# Patient Record
Sex: Female | Born: 1935 | Race: Black or African American | Hispanic: No | Smoking: Never smoker
Health system: Southern US, Community
[De-identification: ages and names within clinical notes are randomized; demographics above are authoritative.]

## PROBLEM LIST (undated history)

## (undated) DIAGNOSIS — R627 Adult failure to thrive: Secondary | ICD-10-CM

## (undated) DIAGNOSIS — F0390 Unspecified dementia without behavioral disturbance: Secondary | ICD-10-CM

## (undated) DIAGNOSIS — R262 Difficulty in walking, not elsewhere classified: Secondary | ICD-10-CM

## (undated) DIAGNOSIS — E860 Dehydration: Secondary | ICD-10-CM

## (undated) DIAGNOSIS — N189 Chronic kidney disease, unspecified: Secondary | ICD-10-CM

## (undated) DIAGNOSIS — E559 Vitamin D deficiency, unspecified: Secondary | ICD-10-CM

## (undated) DIAGNOSIS — M6281 Muscle weakness (generalized): Secondary | ICD-10-CM

## (undated) DIAGNOSIS — F419 Anxiety disorder, unspecified: Secondary | ICD-10-CM

## (undated) DIAGNOSIS — R293 Abnormal posture: Secondary | ICD-10-CM

## (undated) DIAGNOSIS — K219 Gastro-esophageal reflux disease without esophagitis: Secondary | ICD-10-CM

## (undated) DIAGNOSIS — I6990 Unspecified sequelae of unspecified cerebrovascular disease: Secondary | ICD-10-CM

## (undated) DIAGNOSIS — I10 Essential (primary) hypertension: Secondary | ICD-10-CM

---

## 1999-10-14 ENCOUNTER — Encounter: Admission: RE | Admit: 1999-10-14 | Discharge: 1999-10-14 | Payer: Self-pay | Admitting: Neurosurgery

## 1999-10-14 ENCOUNTER — Encounter: Payer: Self-pay | Admitting: Neurosurgery

## 1999-10-16 ENCOUNTER — Inpatient Hospital Stay (HOSPITAL_COMMUNITY): Admission: RE | Admit: 1999-10-16 | Discharge: 1999-10-21 | Payer: Self-pay | Admitting: Neurosurgery

## 1999-10-16 ENCOUNTER — Encounter: Payer: Self-pay | Admitting: Neurosurgery

## 1999-12-02 ENCOUNTER — Encounter: Payer: Self-pay | Admitting: Neurosurgery

## 1999-12-02 ENCOUNTER — Encounter: Admission: RE | Admit: 1999-12-02 | Discharge: 1999-12-02 | Payer: Self-pay | Admitting: Neurosurgery

## 2000-02-02 ENCOUNTER — Encounter: Payer: Self-pay | Admitting: Neurosurgery

## 2000-02-02 ENCOUNTER — Encounter: Admission: RE | Admit: 2000-02-02 | Discharge: 2000-02-02 | Payer: Self-pay | Admitting: Neurosurgery

## 2000-06-07 ENCOUNTER — Encounter: Admission: RE | Admit: 2000-06-07 | Discharge: 2000-06-07 | Payer: Self-pay | Admitting: Neurosurgery

## 2000-06-07 ENCOUNTER — Encounter: Payer: Self-pay | Admitting: Neurosurgery

## 2000-11-08 ENCOUNTER — Encounter: Payer: Self-pay | Admitting: Neurosurgery

## 2000-11-08 ENCOUNTER — Encounter: Admission: RE | Admit: 2000-11-08 | Discharge: 2000-11-08 | Payer: Self-pay | Admitting: Neurosurgery

## 2004-09-01 ENCOUNTER — Ambulatory Visit: Payer: Self-pay | Admitting: Internal Medicine

## 2005-02-03 ENCOUNTER — Ambulatory Visit: Payer: Self-pay | Admitting: Internal Medicine

## 2006-02-23 ENCOUNTER — Ambulatory Visit: Payer: Self-pay | Admitting: Internal Medicine

## 2007-03-04 ENCOUNTER — Ambulatory Visit: Payer: Self-pay | Admitting: Internal Medicine

## 2007-03-15 ENCOUNTER — Ambulatory Visit: Payer: Self-pay | Admitting: Internal Medicine

## 2008-07-09 ENCOUNTER — Inpatient Hospital Stay: Payer: Self-pay | Admitting: Internal Medicine

## 2008-07-24 ENCOUNTER — Other Ambulatory Visit: Payer: Self-pay

## 2008-07-24 ENCOUNTER — Emergency Department: Payer: Self-pay | Admitting: Emergency Medicine

## 2008-07-25 ENCOUNTER — Inpatient Hospital Stay: Payer: Self-pay | Admitting: Internal Medicine

## 2008-11-10 ENCOUNTER — Inpatient Hospital Stay: Payer: Self-pay | Admitting: Internal Medicine

## 2008-11-15 ENCOUNTER — Encounter: Payer: Self-pay | Admitting: Internal Medicine

## 2008-11-30 ENCOUNTER — Encounter: Payer: Self-pay | Admitting: Internal Medicine

## 2009-01-11 ENCOUNTER — Ambulatory Visit: Payer: Self-pay | Admitting: Internal Medicine

## 2009-12-23 ENCOUNTER — Inpatient Hospital Stay: Payer: Self-pay | Admitting: Internal Medicine

## 2009-12-26 ENCOUNTER — Encounter: Payer: Self-pay | Admitting: Internal Medicine

## 2009-12-31 ENCOUNTER — Encounter: Payer: Self-pay | Admitting: Internal Medicine

## 2010-01-10 ENCOUNTER — Ambulatory Visit: Payer: Self-pay | Admitting: Internal Medicine

## 2010-01-11 ENCOUNTER — Inpatient Hospital Stay: Payer: Self-pay | Admitting: Internal Medicine

## 2010-01-28 ENCOUNTER — Encounter: Payer: Self-pay | Admitting: Internal Medicine

## 2010-02-28 ENCOUNTER — Encounter: Payer: Self-pay | Admitting: Internal Medicine

## 2013-02-06 LAB — COMPREHENSIVE METABOLIC PANEL
Albumin: 4.1 g/dL (ref 3.4–5.0)
Anion Gap: 9 (ref 7–16)
Bilirubin,Total: 0.8 mg/dL (ref 0.2–1.0)
Chloride: 110 mmol/L — ABNORMAL HIGH (ref 98–107)
EGFR (African American): 17 — ABNORMAL LOW
EGFR (Non-African Amer.): 15 — ABNORMAL LOW
Glucose: 152 mg/dL — ABNORMAL HIGH (ref 65–99)
Osmolality: 292 (ref 275–301)
SGOT(AST): 33 U/L (ref 15–37)
Total Protein: 7.5 g/dL (ref 6.4–8.2)

## 2013-02-06 LAB — TROPONIN I: Troponin-I: <0.02 ng/mL

## 2013-02-06 LAB — CBC
MCH: 32.5 pg (ref 26.0–34.0)
MCHC: 33 g/dL (ref 32.0–36.0)
Platelet: 181 10*3/uL (ref 150–440)
RDW: 14.7 % — ABNORMAL HIGH (ref 11.5–14.5)
WBC: 6 10*3/uL (ref 3.6–11.0)

## 2013-02-06 LAB — URINALYSIS, COMPLETE
Bilirubin,UR: NEGATIVE
Hyaline Cast: 4
Leukocyte Esterase: NEGATIVE
Squamous Epithelial: 3
WBC UR: 3 /HPF (ref 0–5)

## 2013-02-07 ENCOUNTER — Inpatient Hospital Stay: Payer: Self-pay | Admitting: Internal Medicine

## 2013-02-08 LAB — CK: CK, Total: 743 U/L — ABNORMAL HIGH (ref 21–215)

## 2013-02-08 LAB — BASIC METABOLIC PANEL
Anion Gap: 10 (ref 7–16)
Chloride: 111 mmol/L — ABNORMAL HIGH (ref 98–107)
Co2: 21 mmol/L (ref 21–32)
Glucose: 87 mg/dL (ref 65–99)
Osmolality: 282 (ref 275–301)
Potassium: 3.4 mmol/L — ABNORMAL LOW (ref 3.5–5.1)
Sodium: 142 mmol/L (ref 136–145)

## 2013-02-16 ENCOUNTER — Other Ambulatory Visit: Payer: Self-pay | Admitting: Pediatrics

## 2013-02-16 LAB — CBC WITH DIFFERENTIAL/PLATELET
Basophil #: 0.1 10*3/uL (ref 0.0–0.1)
Eosinophil #: 0.1 10*3/uL (ref 0.0–0.7)
HCT: 38.6 % (ref 35.0–47.0)
HGB: 12.9 g/dL (ref 12.0–16.0)
Lymphocyte %: 16.2 %
MCH: 33 pg (ref 26.0–34.0)
MCHC: 33.5 g/dL (ref 32.0–36.0)
MCV: 99 fL (ref 80–100)
Monocyte #: 0.5 x10 3/mm (ref 0.2–0.9)
Neutrophil #: 3.9 10*3/uL (ref 1.4–6.5)
Neutrophil %: 71.6 %
Platelet: 215 10*3/uL (ref 150–440)
RDW: 14.8 % — ABNORMAL HIGH (ref 11.5–14.5)
WBC: 5.4 10*3/uL (ref 3.6–11.0)

## 2013-02-16 LAB — BASIC METABOLIC PANEL
BUN: 17 mg/dL (ref 7–18)
Calcium, Total: 9 mg/dL (ref 8.5–10.1)
Chloride: 108 mmol/L — ABNORMAL HIGH (ref 98–107)
Creatinine: 1.08 mg/dL (ref 0.60–1.30)
EGFR (African American): 55 — ABNORMAL LOW
Osmolality: 288 (ref 275–301)
Sodium: 144 mmol/L (ref 136–145)

## 2013-11-30 ENCOUNTER — Ambulatory Visit: Payer: Self-pay | Admitting: Internal Medicine

## 2013-12-13 ENCOUNTER — Observation Stay: Payer: Self-pay | Admitting: Specialist

## 2013-12-13 LAB — URINALYSIS, COMPLETE
Bacteria: NONE SEEN
Bilirubin,UR: NEGATIVE
Blood: NEGATIVE
Glucose,UR: NEGATIVE mg/dL (ref 0–75)
Hyaline Cast: 1
Nitrite: NEGATIVE
Ph: 7 (ref 4.5–8.0)
Protein: NEGATIVE
Specific Gravity: 1.008 (ref 1.003–1.030)
Squamous Epithelial: 1
WBC UR: 3 /HPF (ref 0–5)

## 2013-12-13 LAB — COMPREHENSIVE METABOLIC PANEL
ALT: 8 U/L — AB (ref 12–78)
Albumin: 3.8 g/dL (ref 3.4–5.0)
Alkaline Phosphatase: 75 U/L
Anion Gap: 5 — ABNORMAL LOW (ref 7–16)
BUN: 15 mg/dL (ref 7–18)
Bilirubin,Total: 0.8 mg/dL (ref 0.2–1.0)
CO2: 29 mmol/L (ref 21–32)
CREATININE: 1.31 mg/dL — AB (ref 0.60–1.30)
Calcium, Total: 9.2 mg/dL (ref 8.5–10.1)
Chloride: 107 mmol/L (ref 98–107)
EGFR (African American): 43 — ABNORMAL LOW
EGFR (Non-African Amer.): 37 — ABNORMAL LOW
Glucose: 80 mg/dL (ref 65–99)
OSMOLALITY: 281 (ref 275–301)
Potassium: 3.6 mmol/L (ref 3.5–5.1)
SGOT(AST): 21 U/L (ref 15–37)
SODIUM: 141 mmol/L (ref 136–145)
TOTAL PROTEIN: 7.1 g/dL (ref 6.4–8.2)

## 2013-12-13 LAB — CBC
HCT: 36.3 % (ref 35.0–47.0)
HGB: 12.2 g/dL (ref 12.0–16.0)
MCH: 32.2 pg (ref 26.0–34.0)
MCHC: 33.7 g/dL (ref 32.0–36.0)
MCV: 96 fL (ref 80–100)
Platelet: 185 10*3/uL (ref 150–440)
RBC: 3.8 10*6/uL (ref 3.80–5.20)
RDW: 14.9 % — AB (ref 11.5–14.5)
WBC: 3.6 10*3/uL (ref 3.6–11.0)

## 2013-12-13 LAB — TROPONIN I: Troponin-I: <0.02 ng/mL

## 2013-12-27 ENCOUNTER — Observation Stay: Payer: Self-pay | Admitting: Internal Medicine

## 2013-12-27 LAB — CBC
HCT: 34.2 % — ABNORMAL LOW (ref 35.0–47.0)
HGB: 11.6 g/dL — ABNORMAL LOW (ref 12.0–16.0)
MCH: 32.8 pg (ref 26.0–34.0)
MCHC: 33.8 g/dL (ref 32.0–36.0)
MCV: 97 fL (ref 80–100)
Platelet: 223 10*3/uL (ref 150–440)
RBC: 3.53 10*6/uL — ABNORMAL LOW (ref 3.80–5.20)
RDW: 15.4 % — AB (ref 11.5–14.5)
WBC: 5.6 10*3/uL (ref 3.6–11.0)

## 2013-12-27 LAB — CK TOTAL AND CKMB (NOT AT ARMC)
CK, TOTAL: 307 U/L — AB (ref 21–215)
CK, Total: 312 U/L — ABNORMAL HIGH (ref 21–215)
CK, Total: 290 U/L — ABNORMAL HIGH (ref 21–215)
CK-MB: 13.4 ng/mL — AB (ref 0.5–3.6)
CK-MB: 13.8 ng/mL — ABNORMAL HIGH (ref 0.5–3.6)
CK-MB: 9.1 ng/mL — AB (ref 0.5–3.6)

## 2013-12-27 LAB — COMPREHENSIVE METABOLIC PANEL
ALK PHOS: 75 U/L
ALT: 14 U/L (ref 12–78)
AST: 26 U/L (ref 15–37)
Albumin: 3.4 g/dL (ref 3.4–5.0)
Anion Gap: 7 (ref 7–16)
BUN: 26 mg/dL — ABNORMAL HIGH (ref 7–18)
Bilirubin,Total: 0.7 mg/dL (ref 0.2–1.0)
CO2: 26 mmol/L (ref 21–32)
Calcium, Total: 9.4 mg/dL (ref 8.5–10.1)
Chloride: 106 mmol/L (ref 98–107)
Creatinine: 1.85 mg/dL — ABNORMAL HIGH (ref 0.60–1.30)
EGFR (African American): 28 — ABNORMAL LOW
EGFR (Non-African Amer.): 24 — ABNORMAL LOW
Glucose: 104 mg/dL — ABNORMAL HIGH (ref 65–99)
OSMOLALITY: 283 (ref 275–301)
POTASSIUM: 3.8 mmol/L (ref 3.5–5.1)
SODIUM: 139 mmol/L (ref 136–145)
TOTAL PROTEIN: 7.2 g/dL (ref 6.4–8.2)

## 2013-12-27 LAB — URINALYSIS, COMPLETE
Bacteria: NONE SEEN
Bilirubin,UR: NEGATIVE
Blood: NEGATIVE
Glucose,UR: NEGATIVE mg/dL (ref 0–75)
Hyaline Cast: 22
LEUKOCYTE ESTERASE: NEGATIVE
NITRITE: NEGATIVE
Ph: 5 (ref 4.5–8.0)
Protein: 30
SPECIFIC GRAVITY: 1.023 (ref 1.003–1.030)
Squamous Epithelial: 6

## 2013-12-27 LAB — APTT: ACTIVATED PTT: 29.9 s (ref 23.6–35.9)

## 2013-12-27 LAB — PROTIME-INR
INR: 1
Prothrombin Time: 13.5 secs (ref 11.5–14.7)

## 2013-12-27 LAB — TROPONIN I: Troponin-I: <0.02 ng/mL

## 2013-12-27 LAB — AMMONIA: Ammonia, Plasma: <10 mcmol/L (ref 11–32)

## 2013-12-27 LAB — MAGNESIUM: Magnesium: 2.2 mg/dL

## 2013-12-28 LAB — COMPREHENSIVE METABOLIC PANEL
ALBUMIN: 2.6 g/dL — AB (ref 3.4–5.0)
ALK PHOS: 63 U/L
ALT: 11 U/L — AB (ref 12–78)
ANION GAP: 7 (ref 7–16)
BUN: 19 mg/dL — ABNORMAL HIGH (ref 7–18)
Bilirubin,Total: 0.9 mg/dL (ref 0.2–1.0)
CO2: 24 mmol/L (ref 21–32)
CREATININE: 1.36 mg/dL — AB (ref 0.60–1.30)
Calcium, Total: 8.7 mg/dL (ref 8.5–10.1)
Chloride: 113 mmol/L — ABNORMAL HIGH (ref 98–107)
EGFR (African American): 41 — ABNORMAL LOW
GFR CALC NON AF AMER: 35 — AB
GLUCOSE: 80 mg/dL (ref 65–99)
Osmolality: 288 (ref 275–301)
Potassium: 3.5 mmol/L (ref 3.5–5.1)
SGOT(AST): 25 U/L (ref 15–37)
SODIUM: 144 mmol/L (ref 136–145)
Total Protein: 5.7 g/dL — ABNORMAL LOW (ref 6.4–8.2)

## 2013-12-28 LAB — CBC WITH DIFFERENTIAL/PLATELET
BASOS ABS: 0 10*3/uL (ref 0.0–0.1)
Basophil %: 0.7 %
EOS PCT: 2.1 %
Eosinophil #: 0.1 10*3/uL (ref 0.0–0.7)
HCT: 28.2 % — AB (ref 35.0–47.0)
HGB: 9.6 g/dL — ABNORMAL LOW (ref 12.0–16.0)
LYMPHS ABS: 0.6 10*3/uL — AB (ref 1.0–3.6)
LYMPHS PCT: 13.4 %
MCH: 33 pg (ref 26.0–34.0)
MCHC: 34.1 g/dL (ref 32.0–36.0)
MCV: 97 fL (ref 80–100)
Monocyte #: 0.4 x10 3/mm (ref 0.2–0.9)
Monocyte %: 9.1 %
Neutrophil #: 3.5 10*3/uL (ref 1.4–6.5)
Neutrophil %: 74.7 %
Platelet: 204 10*3/uL (ref 150–440)
RBC: 2.92 10*6/uL — AB (ref 3.80–5.20)
RDW: 14.9 % — ABNORMAL HIGH (ref 11.5–14.5)
WBC: 4.7 10*3/uL (ref 3.6–11.0)

## 2013-12-29 LAB — BASIC METABOLIC PANEL
ANION GAP: 6 — AB (ref 7–16)
BUN: 15 mg/dL (ref 7–18)
CHLORIDE: 114 mmol/L — AB (ref 98–107)
CREATININE: 1.22 mg/dL (ref 0.60–1.30)
Calcium, Total: 8.8 mg/dL (ref 8.5–10.1)
Co2: 25 mmol/L (ref 21–32)
EGFR (Non-African Amer.): 40 — ABNORMAL LOW
GFR CALC AF AMER: 47 — AB
Glucose: 80 mg/dL (ref 65–99)
Osmolality: 289 (ref 275–301)
POTASSIUM: 3.3 mmol/L — AB (ref 3.5–5.1)
SODIUM: 145 mmol/L (ref 136–145)

## 2013-12-29 LAB — URINALYSIS, COMPLETE
Bilirubin,UR: NEGATIVE
Blood: NEGATIVE
Glucose,UR: NEGATIVE mg/dL (ref 0–75)
Ketone: NEGATIVE
Nitrite: NEGATIVE
Ph: 6 (ref 4.5–8.0)
Protein: NEGATIVE
RBC,UR: 1 /HPF (ref 0–5)
Specific Gravity: 1.009 (ref 1.003–1.030)
Squamous Epithelial: 5
WBC UR: 17 /HPF (ref 0–5)

## 2013-12-29 LAB — IRON AND TIBC
IRON SATURATION: 29 %
IRON: 48 ug/dL — AB (ref 50–170)
Iron Bind.Cap.(Total): 167 ug/dL — ABNORMAL LOW (ref 250–450)
Unbound Iron-Bind.Cap.: 119 ug/dL

## 2013-12-29 LAB — CBC WITH DIFFERENTIAL/PLATELET
Basophil #: 0 10*3/uL (ref 0.0–0.1)
Basophil %: 0.7 %
Eosinophil #: 0.1 10*3/uL (ref 0.0–0.7)
Eosinophil %: 1.9 %
HCT: 27.9 % — ABNORMAL LOW (ref 35.0–47.0)
HGB: 9.5 g/dL — ABNORMAL LOW (ref 12.0–16.0)
Lymphocyte #: 0.5 10*3/uL — ABNORMAL LOW (ref 1.0–3.6)
Lymphocyte %: 12.8 %
MCH: 33.2 pg (ref 26.0–34.0)
MCHC: 34 g/dL (ref 32.0–36.0)
MCV: 98 fL (ref 80–100)
Monocyte #: 0.4 x10 3/mm (ref 0.2–0.9)
Monocyte %: 10.2 %
Neutrophil #: 3 10*3/uL (ref 1.4–6.5)
Neutrophil %: 74.4 %
Platelet: 208 10*3/uL (ref 150–440)
RBC: 2.86 10*6/uL — ABNORMAL LOW (ref 3.80–5.20)
RDW: 15.2 % — ABNORMAL HIGH (ref 11.5–14.5)
WBC: 4.1 10*3/uL (ref 3.6–11.0)

## 2013-12-30 LAB — BASIC METABOLIC PANEL
ANION GAP: 7 (ref 7–16)
BUN: 10 mg/dL (ref 7–18)
CHLORIDE: 110 mmol/L — AB (ref 98–107)
CO2: 26 mmol/L (ref 21–32)
CREATININE: 1.06 mg/dL (ref 0.60–1.30)
Calcium, Total: 9 mg/dL (ref 8.5–10.1)
EGFR (African American): 55 — ABNORMAL LOW
EGFR (Non-African Amer.): 48 — ABNORMAL LOW
GLUCOSE: 87 mg/dL (ref 65–99)
Osmolality: 283 (ref 275–301)
Potassium: 3.4 mmol/L — ABNORMAL LOW (ref 3.5–5.1)
SODIUM: 143 mmol/L (ref 136–145)

## 2013-12-30 LAB — CBC WITH DIFFERENTIAL/PLATELET
Basophil #: 0 10*3/uL (ref 0.0–0.1)
Basophil %: 0.6 %
EOS ABS: 0 10*3/uL (ref 0.0–0.7)
Eosinophil %: 0.8 %
HCT: 31.3 % — ABNORMAL LOW (ref 35.0–47.0)
HGB: 10.5 g/dL — AB (ref 12.0–16.0)
Lymphocyte #: 0.5 10*3/uL — ABNORMAL LOW (ref 1.0–3.6)
Lymphocyte %: 9.4 %
MCH: 32.7 pg (ref 26.0–34.0)
MCHC: 33.5 g/dL (ref 32.0–36.0)
MCV: 98 fL (ref 80–100)
MONO ABS: 0.4 x10 3/mm (ref 0.2–0.9)
Monocyte %: 7.2 %
Neutrophil #: 4.6 10*3/uL (ref 1.4–6.5)
Neutrophil %: 82 %
Platelet: 236 10*3/uL (ref 150–440)
RBC: 3.2 10*6/uL — AB (ref 3.80–5.20)
RDW: 15.1 % — ABNORMAL HIGH (ref 11.5–14.5)
WBC: 5.6 10*3/uL (ref 3.6–11.0)

## 2013-12-31 ENCOUNTER — Ambulatory Visit: Payer: Self-pay | Admitting: Internal Medicine

## 2013-12-31 ENCOUNTER — Ambulatory Visit: Payer: Self-pay | Admitting: Neurology

## 2013-12-31 LAB — BASIC METABOLIC PANEL
ANION GAP: 6 — AB (ref 7–16)
BUN: 14 mg/dL (ref 7–18)
CALCIUM: 9.4 mg/dL (ref 8.5–10.1)
CO2: 26 mmol/L (ref 21–32)
Chloride: 111 mmol/L — ABNORMAL HIGH (ref 98–107)
Creatinine: 1.24 mg/dL (ref 0.60–1.30)
EGFR (African American): 46 — ABNORMAL LOW
EGFR (Non-African Amer.): 40 — ABNORMAL LOW
Glucose: 79 mg/dL (ref 65–99)
Osmolality: 284 (ref 275–301)
Potassium: 3.5 mmol/L (ref 3.5–5.1)
SODIUM: 143 mmol/L (ref 136–145)

## 2013-12-31 LAB — HEMATOCRIT: HCT: 31.5 % — ABNORMAL LOW (ref 35.0–47.0)

## 2014-01-01 LAB — BASIC METABOLIC PANEL
ANION GAP: 8 (ref 7–16)
BUN: 19 mg/dL — AB (ref 7–18)
CHLORIDE: 110 mmol/L — AB (ref 98–107)
CREATININE: 1.32 mg/dL — AB (ref 0.60–1.30)
Calcium, Total: 9.7 mg/dL (ref 8.5–10.1)
Co2: 24 mmol/L (ref 21–32)
EGFR (African American): 43 — ABNORMAL LOW
EGFR (Non-African Amer.): 37 — ABNORMAL LOW
Glucose: 85 mg/dL (ref 65–99)
Osmolality: 285 (ref 275–301)
Potassium: 3.6 mmol/L (ref 3.5–5.1)
SODIUM: 142 mmol/L (ref 136–145)

## 2014-01-01 LAB — CK: CK, TOTAL: 91 U/L (ref 21–215)

## 2014-03-06 ENCOUNTER — Emergency Department: Payer: Self-pay | Admitting: Emergency Medicine

## 2014-03-06 LAB — URINALYSIS, COMPLETE
BILIRUBIN, UR: NEGATIVE
BLOOD: NEGATIVE
Glucose,UR: NEGATIVE mg/dL (ref 0–75)
Nitrite: NEGATIVE
PH: 5 (ref 4.5–8.0)
Protein: NEGATIVE
RBC,UR: 3 /HPF (ref 0–5)
SPECIFIC GRAVITY: 1.02 (ref 1.003–1.030)
Squamous Epithelial: 1

## 2014-03-06 LAB — COMPREHENSIVE METABOLIC PANEL
ALBUMIN: 3.6 g/dL (ref 3.4–5.0)
ALK PHOS: 93 U/L
Anion Gap: 6 — ABNORMAL LOW (ref 7–16)
BILIRUBIN TOTAL: 0.6 mg/dL (ref 0.2–1.0)
BUN: 20 mg/dL — ABNORMAL HIGH (ref 7–18)
CREATININE: 1.51 mg/dL — AB (ref 0.60–1.30)
Calcium, Total: 9.5 mg/dL (ref 8.5–10.1)
Chloride: 107 mmol/L (ref 98–107)
Co2: 27 mmol/L (ref 21–32)
EGFR (African American): 36 — ABNORMAL LOW
EGFR (Non-African Amer.): 31 — ABNORMAL LOW
Glucose: 96 mg/dL (ref 65–99)
OSMOLALITY: 282 (ref 275–301)
Potassium: 4.8 mmol/L (ref 3.5–5.1)
SGOT(AST): 23 U/L (ref 15–37)
SGPT (ALT): 12 U/L (ref 12–78)
Sodium: 140 mmol/L (ref 136–145)
Total Protein: 7.1 g/dL (ref 6.4–8.2)

## 2014-03-06 LAB — CBC
HCT: 37.4 % (ref 35.0–47.0)
HGB: 12.5 g/dL (ref 12.0–16.0)
MCH: 32.1 pg (ref 26.0–34.0)
MCHC: 33.4 g/dL (ref 32.0–36.0)
MCV: 96 fL (ref 80–100)
Platelet: 274 10*3/uL (ref 150–440)
RBC: 3.89 10*6/uL (ref 3.80–5.20)
RDW: 15.3 % — ABNORMAL HIGH (ref 11.5–14.5)
WBC: 5.1 10*3/uL (ref 3.6–11.0)

## 2014-03-06 LAB — TROPONIN I: Troponin-I: <0.02 ng/mL

## 2014-03-08 LAB — URINE CULTURE

## 2014-03-12 ENCOUNTER — Other Ambulatory Visit: Payer: Self-pay | Admitting: Family Medicine

## 2014-03-12 LAB — BASIC METABOLIC PANEL
ANION GAP: 6 — AB (ref 7–16)
BUN: 16 mg/dL (ref 7–18)
CHLORIDE: 108 mmol/L — AB (ref 98–107)
CO2: 28 mmol/L (ref 21–32)
Calcium, Total: 9.2 mg/dL (ref 8.5–10.1)
Creatinine: 1.29 mg/dL (ref 0.60–1.30)
EGFR (African American): 44 — ABNORMAL LOW
GFR CALC NON AF AMER: 38 — AB
GLUCOSE: 100 mg/dL — AB (ref 65–99)
Osmolality: 284 (ref 275–301)
POTASSIUM: 4.4 mmol/L (ref 3.5–5.1)
Sodium: 142 mmol/L (ref 136–145)

## 2014-03-13 ENCOUNTER — Ambulatory Visit: Payer: Self-pay | Admitting: Vascular Surgery

## 2014-03-13 LAB — BASIC METABOLIC PANEL
Anion Gap: 4 — ABNORMAL LOW (ref 7–16)
BUN: 15 mg/dL (ref 7–18)
CREATININE: 1.22 mg/dL (ref 0.60–1.30)
Calcium, Total: 9.3 mg/dL (ref 8.5–10.1)
Chloride: 108 mmol/L — ABNORMAL HIGH (ref 98–107)
Co2: 28 mmol/L (ref 21–32)
EGFR (African American): 47 — ABNORMAL LOW
GFR CALC NON AF AMER: 40 — AB
GLUCOSE: 79 mg/dL (ref 65–99)
Osmolality: 279 (ref 275–301)
Potassium: 3.7 mmol/L (ref 3.5–5.1)
Sodium: 140 mmol/L (ref 136–145)

## 2014-03-25 ENCOUNTER — Other Ambulatory Visit: Payer: Self-pay

## 2014-03-25 LAB — URINALYSIS, COMPLETE
BACTERIA: NONE SEEN
Bilirubin,UR: NEGATIVE
Blood: NEGATIVE
GLUCOSE, UR: NEGATIVE mg/dL (ref 0–75)
Hyaline Cast: 3
Leukocyte Esterase: NEGATIVE
Nitrite: NEGATIVE
PH: 5 (ref 4.5–8.0)
Protein: NEGATIVE
RBC,UR: 1 /HPF (ref 0–5)
Specific Gravity: 1.017 (ref 1.003–1.030)
Squamous Epithelial: 1

## 2014-03-27 ENCOUNTER — Ambulatory Visit: Payer: Self-pay | Admitting: Vascular Surgery

## 2014-03-27 LAB — BASIC METABOLIC PANEL
Anion Gap: 4 — ABNORMAL LOW (ref 7–16)
BUN: 18 mg/dL (ref 7–18)
Calcium, Total: 9.2 mg/dL (ref 8.5–10.1)
Chloride: 108 mmol/L — ABNORMAL HIGH (ref 98–107)
Co2: 31 mmol/L (ref 21–32)
Creatinine: 1.25 mg/dL (ref 0.60–1.30)
EGFR (Non-African Amer.): 39 — ABNORMAL LOW
GFR CALC AF AMER: 45 — AB
GLUCOSE: 81 mg/dL (ref 65–99)
Osmolality: 286 (ref 275–301)
Potassium: 3.8 mmol/L (ref 3.5–5.1)
Sodium: 143 mmol/L (ref 136–145)

## 2014-03-27 LAB — URINE CULTURE

## 2014-08-27 ENCOUNTER — Other Ambulatory Visit: Payer: Self-pay | Admitting: Family Medicine

## 2014-08-27 LAB — CBC WITH DIFFERENTIAL/PLATELET
BASOS ABS: 0 10*3/uL (ref 0.0–0.1)
Basophil %: 0.5 %
EOS ABS: 0 10*3/uL (ref 0.0–0.7)
Eosinophil %: 0.2 %
HCT: 43.6 % (ref 35.0–47.0)
HGB: 14 g/dL (ref 12.0–16.0)
LYMPHS PCT: 17.7 %
Lymphocyte #: 1.3 10*3/uL (ref 1.0–3.6)
MCH: 30 pg (ref 26.0–34.0)
MCHC: 32.1 g/dL (ref 32.0–36.0)
MCV: 93 fL (ref 80–100)
Monocyte #: 0.1 x10 3/mm — ABNORMAL LOW (ref 0.2–0.9)
Monocyte %: 1.4 %
NEUTROS ABS: 5.7 10*3/uL (ref 1.4–6.5)
NEUTROS PCT: 80.2 %
Platelet: 298 10*3/uL (ref 150–440)
RBC: 4.67 10*6/uL (ref 3.80–5.20)
RDW: 16 % — ABNORMAL HIGH (ref 11.5–14.5)
WBC: 7.1 10*3/uL (ref 3.6–11.0)

## 2014-08-27 LAB — BASIC METABOLIC PANEL
Anion Gap: 12 (ref 7–16)
BUN: 23 mg/dL — AB (ref 7–18)
CHLORIDE: 107 mmol/L (ref 98–107)
Calcium, Total: 9.7 mg/dL (ref 8.5–10.1)
Co2: 22 mmol/L (ref 21–32)
Creatinine: 1.69 mg/dL — ABNORMAL HIGH (ref 0.60–1.30)
EGFR (African American): 37 — ABNORMAL LOW
EGFR (Non-African Amer.): 30 — ABNORMAL LOW
GLUCOSE: 143 mg/dL — AB (ref 65–99)
Osmolality: 287 (ref 275–301)
Potassium: 3.7 mmol/L (ref 3.5–5.1)
SODIUM: 141 mmol/L (ref 136–145)

## 2014-09-20 ENCOUNTER — Ambulatory Visit: Payer: Self-pay | Admitting: Family Medicine

## 2014-09-20 LAB — BASIC METABOLIC PANEL
Anion Gap: 6 — ABNORMAL LOW (ref 7–16)
BUN: 19 mg/dL — ABNORMAL HIGH (ref 7–18)
Calcium, Total: 8.9 mg/dL (ref 8.5–10.1)
Chloride: 110 mmol/L — ABNORMAL HIGH (ref 98–107)
Co2: 29 mmol/L (ref 21–32)
Creatinine: 1.02 mg/dL (ref 0.60–1.30)
EGFR (African American): >60
GFR CALC NON AF AMER: 55 — AB
GLUCOSE: 108 mg/dL — AB (ref 65–99)
Osmolality: 291 (ref 275–301)
Potassium: 3.4 mmol/L — ABNORMAL LOW (ref 3.5–5.1)
Sodium: 145 mmol/L (ref 136–145)
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
## 2015-03-23 NOTE — H&P (Signed)
PATIENT NAME:  Tonya King, Tonya King MR#:  [id number] DATE OF BIRTH:  05-28-1926  DATE OF ADMISSION:  12/27/2013  PRIMARY CARE PHYSICIAN: John B. Danne HarborWalker III, MD  HISTORY OF PRESENT ILLNESS:  The patient is a 81 year old African American female with past medical history significant for history of falls, admission in January 14th to 16th to the hospital because of the falls, history of stroke, dementia, hypertension, hyperlipidemia, chronic renal insufficiency, who presents to the hospital with complaints of questionable chest pain as well as altered mental status. Apparently, the patient was in the family care home after discharge from the hospital on the 16th of January 2015.  The patient's daughter was called by the owner of this home, who told that the patient is not responding the way she usually is responding.  Apparently, the patient usually is confused; however, able to talk, however, now she is much more sleepy today and not responding to verbal stimuli. She is not verbalizing her concerns as well. She was complaining, however, of possible chest pain versus pain in upper abdomen. The patient's daughter is not really sure about that. On arrival to the Emergency Room, she was interviewed by Emergency Room physician and admitted and admitted with some chest or abdominal pains; however, was not able to elaborate much more. Her labs revealed renal insufficiency and because the patient was very weak and there was change of her mental status, hospitalist services were contacted for admission.   PAST MEDICAL HISTORY: Significant for recent admission to the hospital from the 14th to the 16th of January 2015, for falls. Also history of stroke, also possible transient ischemic attack, hypertension, hyperlipidemia, dementia, history of stroke with right-sided weakness,  hysterectomy, previous back surgery. Also urinary incontinence and questionable chronic renal insufficiency.   MEDICATIONS: The patient's medication  list is as follows: Aggrenox 25/200 one capsule twice daily, amlodipine 5 mg p.o. daily, donepezil 5 mg at bedtime, lorazepam 0.5 mg every 8 hours as needed, Remeron 30 mg p.o. at bedtime, simvastatin 10 mg p.o. at bedtime.   ALLERGIES: No known drug allergies.   SOCIAL HISTORY: No alcohol or tobacco abuse. The patient resides in a family care home.    FAMILY HISTORY: No family history of coronary artery disease.   REVIEW OF SYSTEMS: Not available as the patient has altered mental status. She is very somnolent as well as somewhat confused.   PHYSICAL EXAMINATION: VITAL SIGNS:  On arrival to the Emergency Room, the patient's temperature was 97.4, pulse was 84, respiratory rate was 18, blood pressure 136/84, saturation was 94% on room air.  GENERAL: This is a well-developed, well-nourished African American female in no significant distress, sitting on the stretcher. She is difficult to arouse; however, upon arousal she is barely able to speak up or open her mouth. She is also having difficulty opening her eyelids.  HEENT: Her pupils are equal, reactive to light. Extraocular movements intact. No icterus or conjunctivitis.  Has normal hearing. No pharyngeal erythema. Mucosa is dry.  NECK:  No masses. Supple, nontender. Thyroid is not enlarged. No adenopathy. No JVD or carotid bruits bilaterally. Full range of motion.  LUNGS: Clear to auscultation in all fields anteriorly.  No rales, rhonchi, diminished breath sounds or wheezing. No labored inspirations, increased effort, dullness to percussion. Not in overt respiratory distress.  CARDIOVASCULAR: S1, S2 present.  PMI is not lateralized.  Rhythm is regular, no murmur. Chest is nontender to palpation, 1+ pedal pulses. Trace lower extremity edema as well as induration, but  no calf tenderness or cyanosis was noted.  ABDOMEN: Soft, nontender. Bowel sounds are present. No hepatosplenomegaly or masses were noted.  RECTAL: Deferred.  MUSCLE STRENGTH: Has  difficulty moving extremities.  No cyanosis, no degenerative joint disease or kyphosis was  noted. No CVA tenderness bilaterally on percussion.  SKIN: Did not reveal any rashes, lesions, erythema or nodularity. The patient's lower extremities were indurated, otherwise skin was warm and dry to palpation except acral areas of her hands as well as feet, that were somewhat cool to palpation.  LYMPHATIC: No adenopathy in the cervical region.  NEUROLOGICAL: Cranial nerves grossly intact. Difficult to assess sensory. She is somewhat dysarthric but her mouth is dry. She is very somnolent, poorly aroused, poorly cooperative. Memory is very impaired. She is confused and able to be oriented only to self.   LABORATORY, DIAGNOSTIC AND RADIOLOGICAL DATA:   1.  Labs: BMP showed glucose of 104, BUN and creatinine were 26 and 1.85, otherwise BMP was unremarkable. Magnesium level was normal at 2.2. Liver enzymes were normal. Troponin was less than 0.02. White blood cell count was 5.6, hemoglobin was 11.6, platelet count 223. Coagulation panel within normal limits. Urinalysis amber, hazy urine, negative for glucose or bilirubin, trace ketones, specific gravity 1.023, pH was 5.0, negative for blood, 30 mg/dL protein, negative for nitrites or leukocyte esterase, less than 1 red blood cell, 1 white blood cell, no bacteria was seen, 6 epithelial cells as well as 22 hyaline casts.  2.  Radiologic studies: Chest x-ray PA and lateral, on the 28th of January 2015, showed low lung volumes without radiographic evidence of acute cardiopulmonary disease. Mild cardiomegaly, tortuous as well as atherosclerotic thoracic aorta according to radiology.  3.  EKG showed normal sinus rhythm at rate of 80 beats per minute, left axis deviation, voltage criteria for LVH and Q waves in V3. Nonspecific ST-T changes were noted. The patient's EKG, when compared to prior EKG done on the 14th of January 2015, is somewhat different. The patient on the 14th  of January 2015, had R waves in V3, now those R waves are somewhat obscured and Q waves appeared, however, in the past the patient had just Q waves in V2. Nonspecific changes in lateral leads were also noted.   ASSESSMENT AND PLAN: 1.  Altered mental status of unclear atrial etiology, likely metabolic encephalopathy. Admit the patient to medical floor. Continue her on IV fluids. Follow her clinical status, possibly dehydration-related.  2.  Questionable chest pain. We will get cardiac enzymes x 3. Will also continue the patient on aspirin.  Will also add nitroglycerin topically and we will get echocardiogram done.  3.  Acute on chronic renal failure. We will continue IV fluids.  4.  Hyperglycemia. Get hemoglobin A1c to rule out diabetes mellitus.  6.  Anemia. Get guaiac.   TIME SPENT: 50 minutes.    ____________________________ Katharina Caper, MD rv:cs D: 12/27/2013 13:01:00 ET T: 12/27/2013 14:59:11 ET JOB#: [id number]  cc: Katharina Caper, MD, <Dictator> John B. Danne Harbor, MD Daelan Gatt Winona Legato MD ELECTRONICALLY SIGNED 01/30/2014 20:55

## 2015-03-23 NOTE — H&P (Signed)
PATIENT NAME:  Tonya King, Tonya King MR#: [name] [id number] DATE OF BIRTH:  09/29/1926  DATE OF ADMISSION:  12/13/2013  PRIMARY CARE PHYSICIAN: Dr. Yates DecampJohn Walker.  CHIEF COMPLAINT: Recurrent falls.   HISTORY OF PRESENT ILLNESS: This is an 81 year old female who presents to the Emergency Room from her home due to an unwitnessed fall. The patient herself has baseline dementia, therefore, most of the history obtained from the chart. The patient apparently has been having frequent falls now and has had some failure to thrive and is generally weak. She had an unwitnessed fall today and was brought to the ER for further evaluation. The patient denied any pain, was not noted to have any acute injuries. She is only oriented to herself but not to time, place or person.   Given her history of recurrent falls, hospitalist services were contacted for further treatment and evaluation.   REVIEW OF SYSTEMS: Otherwise unobtainable given the patient's mental status.   PAST MEDICAL HISTORY: Consistent with history of previous CVA, dementia, hypertension, hyperlipidemia, history of chronic renal insufficiency.   ALLERGIES: No known drug allergies.   SOCIAL HISTORY: No history of smoking or alcohol abuse. No illicit drug abuse. Lives by herself.   FAMILY HISTORY: Positive for high blood pressure, and also her mother died of a malignancy of unknown type.   CURRENT MEDICATIONS: As follows: Aggrenox 1 tab b.i.d., lorazepam 0.5 mg q.8 hours as needed, Remeron 30 mg at bedtime, amlodipine 5 mg daily, simvastatin 20 mg daily, and Aricept 5 mg at bedtime.   PHYSICAL EXAMINATION: Presently is as follows:  VITAL SIGNS: Temperature is 97.4, pulse 99, respirations 18, blood pressure 157/121, sats 98% on room air.  GENERAL: She is a pleasant-appearing female, but in no apparent distress.  HEENT: Atraumatic, normocephalic. Her extraocular muscles are intact. Her pupils are round, reactive to light. Her sclerae are anicteric. No  conjunctival injection. No pharyngeal erythema.  NECK: Supple. There is no jugular venous angina. No bruits, no lymphadenopathy, no thyromegaly.  HEART: Regular rate and rhythm. No murmurs, no rubs, no clicks.  LUNGS: Clear to auscultation bilaterally. No rales, no rhonchi, no wheezes.  ABDOMEN: Soft, flat, nontender, nondistended. Has good bowel sounds. No hepatosplenomegaly appreciated.  EXTREMITIES: No evidence of any cyanosis, clubbing or peripheral edema. Has +2 pedal and radial pulses bilaterally.  NEUROLOGICAL: The patient is alert, awake, oriented x 1. Moves all extremities spontaneously. Globally weak. Difficult to do a full neurological exam.  SKIN: Moist and warm with no rashes.  LYMPHATIC: There is no cervical or axillary lymphadenopathy.   LABORATORY AND RADIOLOGICAL DATA: Showed a serum glucose of 80, BUN 15, creatinine 1.3, sodium 141, potassium 3.6, chloride 107, bicarbonate 29. LFTs are within normal limits. Troponin less than 0.02. White cell count 3.6, hemoglobin 12.2, hematocrit 36.3, platelet count 185.   The patient did have a CT of the head done without contrast, which showed no evidence of acute intracranial hemorrhage or evolving ischemic event, atrophy with age-related changes, no evidence of intracranial mass effect, no evidence of acute skull fracture.   The patient also had a chest x-ray done, which showed mild atelectasis at right lung base, stable cardiomegaly, no CHF. 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   ASSESSMENT AND PLAN: This is an 81 year old female with a history of dementia, hypertension, hyperlipidemia, who presents to the hospital due to frequent falls and generalized weakness and also noted to be  in acute renal failure.  1.  Generalized weakness and frequent falls: This is likely related to her dementia and also deconditioning and failure to thrive.  For now, there is no evidence of acute focal metabolic or infectious source. Her urinalysis is currently pending. CT head showed no evidence of acute intracranial process. I will get a physical therapy consult to assess her mobility. She likely may benefit from being placed in short-term rehab.  2.  Acute renal failure: I will gently hydrate her with IV fluids, follow BUN and creatinine. Renal dose meds, avoid nephrotoxins.  3.  Hypertension. Her blood pressure is kind of labile presently. I will continue her home medications including Norvasc, place her on IV hydralazine, and follow hemodynamics.  4.  History of previous cerebrovascular accident: Continue Aggrenox.  5.  Hyperlipidemia: Continue simvastatin.  6.  The patient is a full code.  7.  The patient will be transferred over to Dr. Tedra Coupe service.    TIME SPENT: 45 minutes.    ____________________________ Rolly Pancake. Cherlynn Kaiser, MD vjs:jcm D: 12/13/2013 16:29:08 ET T: 12/13/2013 17:37:05 ET JOB#: [id number]  cc: Rolly Pancake. Cherlynn Kaiser, MD, <Dictator> Houston Siren MD ELECTRONICALLY SIGNED 12/27/2013 11:28

## 2015-03-23 NOTE — Consult Note (Signed)
PATIENT NAME:  Tonya LunaICHOLS, Sharnita MR#:  [id number] DATE OF BIRTH:  1926/07/10  DATE OF CONSULTATION:  12/30/2013  CONSULTING PHYSICIAN:  Welby Montminy K. Katherin Ramey, MD  AGE: 81. SEX: Female. RACE: African American.  SUBJECTIVE: The patient was seen in the room along with her daughter, who has been taking care of her. The patient is an 81 year old black female who is widowed since early 2000, and daughter has been taking care of her. Daughter reports that her father, who died in early 2000, noticed change in patient and she had been forgetful since then. This forgetfulness has been getting worse. She had a CVA in 2008, and currently she is not being followed by any neurologist. She has been living at family care home recently because daughter has difficulty taking care of her at home, and family care home staff observed lots of changes in her and they are suspicious that she is paranoid and she is not able to take care of herself and she needs help with ADLs and everything. Daughter reports that she can barely recognize her and sometimes she calls daughter by her name, or sometimes she calls her mama. She is not oriented to the date or time and she stays confused.   OBJECTIVE: The patient was seen lying in bed. She is sleeping, probably secondary to the medications. Daughter reports that she is okay because she is calm and resting. If not, she gets agitated and gets combative, especially during the evenings according to the nursing staff. Further mental status was not done at this time.   PAST PSYCHIATRIC HISTORY: No previous history of inpatient hold psychiatry. No history of suicide attempts. Not being followed by any psychiatrist.  ALCOHOL AND DRUGS: Denies.  RECOMMENDATION: Increase Aricept to 10 mg p.o. daily. Ativan 1 mg p.o. or IV every 6 hours p.r.n. for agitation. Continue Haldol 1 to 2 mg p.r.n. for agitation and watch. The patient may need a neurologist evaluation to see if she had a new CVA and follow  up with the same and for stabilization of her dementia.  ____________________________ Jannet MantisSurya K. Guss Bundehalla, MD skc:jcm D: 12/30/2013 15:31:52 ET T: 12/30/2013 16:14:01 ET JOB#: [id number]  cc: Monika SalkSurya K. Guss Bundehalla, MD, <Dictator> Beau FannySURYA K Cheron Coryell MD ELECTRONICALLY SIGNED 12/31/2013 16:42

## 2015-03-23 NOTE — Consult Note (Signed)
   Comments   Pt discharged today. Received a request to call pt's daughter which I did. She had a question about the different roles of the healthcare team and had some concern about the role of psychiatry. She asks specifically about availability of a facility to provide comfort if needed in the future. I explained that patient did not appear to be at end of life now, although daughter recognizes that her dementia will progress and she may one day need comfort measures. We talked about the use of the [facility]. We also talked about use of palliative care in the nursing home. All questions answered.   Electronic Signatures: Borders, Daryl EasternJoshua R (NP)  (Signed 03-Feb-15 14:57)  Authored: Palliative Care   Last Updated: 03-Feb-15 14:57 by Malachy MoanBorders, Joshua R (NP)

## 2015-03-23 NOTE — Consult Note (Signed)
PATIENT NAME:  Tonya King, Tonya King MR#:  [id number] DATE OF BIRTH:  Jun 05, 1926  DATE OF CONSULTATION:  12/31/2013  REFERRING PHYSICIAN:   CONSULTING PHYSICIAN:  Pauletta Browns, MD  REASON FOR CONSULTATION:  Altered mental status.  HISTORY OF PRESENT ILLNESS:  This is a pleasant 81 year old female with past medical history of falls, history of strokes in the past, chronic what appears to be Alzheimer's dementia versus multi-infarct dementia, hypertension, chronic renal insufficiency, transferred from family care facility with decreased responsiveness, agitation. As per the patient's daughter, who is at bedside, the patient has been more sleepy than usual in the past week. The patient had strokes, the last one in 2008, but she is not being followed by a neurologist, currently on Aricept which was increased to 10 mg by psychiatry. Throughout this hospital course there were periods of agitation for which she required Haldol and Ativan.   REVIEW OF SYSTEMS: Unable to obtain.   LABORATORY, DIAGNOSTIC AND RADIOLOGICAL DATA:  The patient had a CAT scan of the head that was done on 12/19/2013, shows atrophy, microvascular ischemia. Laboratory workup includes hematocrit 31.5. Glucose 79, BUN 14, creatinine 1.24, sodium is 143, potassium 3.5, chloride is 111.   PAST MEDICAL HISTORY: History of recent admission due to falls, hypertension, hyperlipidemia, dementia.   MEDICATIONS: Include Aggrenox, amlodipine, Aricept 5 mg at bedtime, lorazepam, Remeron, simvastatin.   ALLERGIES: No known drug allergies.   SOCIAL HISTORY: No alcohol or tobacco use. The patient resides at family care facility.   FAMILY HISTORY:  No family history.   PHYSICAL EXAMINATION:  VITAL SIGNS:  Temperature 98.1, pulse 84, respirations 18, blood pressure 113/79.  NEUROLOGICAL EXAMINATION: The patient could only tell me her name. Does not know date, time, location, president of the [place]. Short-term memory and long-term memory  is severely affected. Speech is slow. Repetition is very slow as well.  Cranial nerve examination: Pupils 3 mm, 2 mm, reactive bilaterally. Facial sensation intact. Facial motor intact. No droop noticed. Shoulder shrug intact. On motor strength examination, the patient appears to be having slight right upper extremity drift which is chronic since she had a stroke in 2008. She is left-hand dominant. Sensation appears to be intact throughout. Reflexes diminished throughout. Coordination: Finger-to-nose slow but intact, much more brisk on the left. Generalized weakness in the lower extremities.    IMPRESSION: An 81 year old female with hypertension, hyperlipidemia, history of stroke in 2008, microvascular ischemic changes on the CAT scan, presenting with increased confusion. I think current symptoms are a combination of delirium, progressive dementia which is a combination of Alzheimer's and multi-infarct dementia based on her CAT scan. Her Aricept was increased to 10 mg a day it can be held at that dose.   PLAN: Continue her Aricept at 10 mg a day even though I do not think acutely it will make any changes. Limit the use of Haldol and Ativan as much as possible as I think it is just going to worsen her delirium in the long run.  Would appreciate a sitter if possible rather than sedation. The patient should follow up with neurology as an outpatient for dementia workup as she has not seen neurologist as an outpatient. At this point, I do not think the patient is having any acute strokes or any need for acute neurological prevention or any further imaging at this point.   Thank you, it was a pleasure seeing this patient. Please call if any questions.    ____________________________ Pauletta Browns, MD yz:cs D:  12/31/2013 13:27:04 ET T: 12/31/2013 15:43:18 ET JOB#: [id number]  cc: Pauletta BrownsYuriy Keshayla Schrum, MD, <Dictator> Pauletta BrownsYURIY Samie Reasons MD ELECTRONICALLY SIGNED 01/22/2014 13:12

## 2015-03-23 NOTE — Discharge Summary (Signed)
PATIENT NAME:  Tonya King, Tonya King MR#:  [id number] DATE OF BIRTH:  1926/08/15  DATE OF ADMISSION:  12/27/2013 DATE OF DISCHARGE:  01/02/2014  HISTORY OF PRESENT ILLNESS: [name] is a 81 year old black lady, who had been admitted recently with failure to thrive from home where her daughter taking care of her pretty much totally, despite her declining health. The patient had been having multiple falls. She had a history of previous cerebrovascular accident and senile dementia. She was placed in assisted living, but developed altered mental status and agitated behavior. She would not eat or drink. According to the daughter, she was not happy about being in a facility. She was sent to the ER because of her altered mental status. She cannot verbalize well, but somebody got a history of possible chest pain. She was therefore admitted for further evaluation.   PAST MEDICAL HISTORY: Notable for hypertension, hyperlipidemia, senile dementia, history of previous CVA with right-sided weakness and a history of transient ischemic attacks. The patient was known to have chronic renal insufficiency. She had a history of urinary incontinence.   PREVIOUS SURGICAL HISTORY: Included a previous hysterectomy and also a procedure on her back.   ADMISSION MEDICATIONS: Aggrenox 25/200, 1 capsule b.i.d., amlodipine 5 mg daily, Aricept 5 mg at bedtime, lorazepam 0.5 mg every 8 hours as needed and Remeron 30 mg at bedtime. The patient was also on simvastatin 10 mg at bedtime.   ALLERGIES: No known drug allergies.   PHYSICAL EXAMINATION:   ADMISSION VITAL SIGNS: Showed temperature 97.4, pulse 84, respirations 18, blood pressure 136/84 and pulse oximetry was 94 on room air.  GENERAL: The patient was lethargic when she first presented, but also had periods of severe agitation.  HEENT: She was noted to have dry mucous membranes.  LUNGS: The lung fields were clear.  HEART: Sounds were normal.  EXTREMITIES: She did have stasis  edema.  NEUROLOGIC: Felt to be nonfocal.   The patient's admission CBC showed a hemoglobin of 11.6 with a hematocrit of 34.2. White count was 5600. Platelet count was 223,000. Admission comprehensive metabolic panel was notable for a BUN of 26 with a creatinine 1.85. Estimated GFR was 24. Magnesium was 2.2. Troponin was undetectable. Ammonia was less than 10. CPK was 307 with an MB of 13.4. Admission urinalysis showed 30 mg/dl of protein. Microscopic was relatively unremarkable. Admission EKG showed a normal sinus rhythm with left ventricular hypertrophy. An echocardiogram was a limited by patient cooperation. However, she was noted to have normal left ventricular systolic function. There was moderate left ventricular hypertrophy. There was mild to moderate mitral valve regurgitation. There was moderately elevated pulmonary artery systolic pressure. There was moderately increased left ventricular posterior wall thickness. Admission chest x-ray showed mild cardiomegaly, but no acute disease. Unenhanced head CT in the Emergency Room showed atrophy with chronic microvascular ischemia, but no acute abnormality.   HOSPITAL COURSE: The patient was admitted to the regular medical floor where she was started on IV fluids for rehydration. Consultation with the behavioral unit was obtained to help with managing her dementia with agitation. They requested a neurologic consult, which was obtained rule out recent CVA. No evidence of recent CVA was found by the consulting physician, however. The patient actually gradually improved during her hospitalization. She continued to wax and wane though in terms of her degree of lucency. She would be extremely lucent and pleasant on one day and she would be hard to arouse the next. Her agitated behavior however resolved. She was  seen in consultation by physical therapy with which she did fairly well.   DISCHARGE DIAGNOSES:  1.  Multi-infarct dementia with agitation.  2.  Dementia  of the Alzheimer's type.  3.  Hypertension.  4.  History of previous cerebrovascular accident.  5.  Hyperlipidemia   DISCHARGE MEDICATIONS:  1.  Acetaminophen 650 mg every 4 hours p.r.n. pain or temperature.  2.  Enteric-coated aspirin 81 mg daily.  3.  Aggrenox 1 capsule b.i.d.  4.  Phenergan 6.25 to 12.5 mg IM q.4 hours p.r.n. nausea.  5.  Olanzapine disintegrating tablet 5 mg daily.  6.  Olanzapine 5 mg at bedtime p.r.n. agitation.  7.  Amlodipine 10 mg daily.  8.  Cipro 500 mg b.i.d. for an additional 3 days.  9.  Aricept 10 mg at bedtime.  10. Lorazepam 1 mg every 6 hours p.r.n. agitation.   PLAN: The patient is being discharged with activity as tolerated. She was discharged on a mechanical soft diet.   CODE STATUS: The patient is a no code, DNR. Her prognosis is guarded.   ____________________________ Letta Pate Danne Harbor, MD jbw:aw D: 01/02/2014 12:31:55 ET T: 01/02/2014 12:41:37 ET JOB#: [id number]  cc: Jonny Ruiz B. Danne Harbor, MD, <Dictator> Elmo Putt III MD ELECTRONICALLY SIGNED 01/05/2014 15:19

## 2016-04-26 ENCOUNTER — Encounter: Payer: Self-pay | Admitting: Emergency Medicine

## 2016-04-26 ENCOUNTER — Emergency Department: Payer: Medicare Other

## 2016-04-26 ENCOUNTER — Emergency Department
Admission: EM | Admit: 2016-04-26 | Discharge: 2016-04-26 | Disposition: A | Discharge status: Home / Self Care | Payer: Medicare Other | Attending: Emergency Medicine | Admitting: Emergency Medicine

## 2016-04-26 DIAGNOSIS — W050XXA Fall from non-moving wheelchair, initial encounter: Secondary | ICD-10-CM | POA: Diagnosis not present

## 2016-04-26 DIAGNOSIS — I10 Essential (primary) hypertension: Secondary | ICD-10-CM

## 2016-04-26 DIAGNOSIS — S0003XA Contusion of scalp, initial encounter: Secondary | ICD-10-CM | POA: Insufficient documentation

## 2016-04-26 DIAGNOSIS — N189 Chronic kidney disease, unspecified: Secondary | ICD-10-CM | POA: Diagnosis not present

## 2016-04-26 DIAGNOSIS — Y929 Unspecified place or not applicable: Secondary | ICD-10-CM | POA: Insufficient documentation

## 2016-04-26 DIAGNOSIS — Y999 Unspecified external cause status: Secondary | ICD-10-CM | POA: Insufficient documentation

## 2016-04-26 DIAGNOSIS — I129 Hypertensive chronic kidney disease with stage 1 through stage 4 chronic kidney disease, or unspecified chronic kidney disease: Secondary | ICD-10-CM | POA: Insufficient documentation

## 2016-04-26 DIAGNOSIS — N390 Urinary tract infection, site not specified: Secondary | ICD-10-CM

## 2016-04-26 DIAGNOSIS — S0990XA Unspecified injury of head, initial encounter: Secondary | ICD-10-CM | POA: Diagnosis present

## 2016-04-26 DIAGNOSIS — Y9389 Activity, other specified: Secondary | ICD-10-CM | POA: Diagnosis not present

## 2016-04-26 HISTORY — DX: Vitamin D deficiency, unspecified: E55.9

## 2016-04-26 HISTORY — DX: Muscle weakness (generalized): M62.81

## 2016-04-26 HISTORY — DX: Anxiety disorder, unspecified: F41.9

## 2016-04-26 HISTORY — DX: Unspecified dementia, unspecified severity, without behavioral disturbance, psychotic disturbance, mood disturbance, and anxiety: F03.90

## 2016-04-26 HISTORY — DX: Chronic kidney disease, unspecified: N18.9

## 2016-04-26 HISTORY — DX: Difficulty in walking, not elsewhere classified: R26.2

## 2016-04-26 HISTORY — DX: Unspecified sequelae of unspecified cerebrovascular disease: I69.90

## 2016-04-26 HISTORY — DX: Adult failure to thrive: R62.7

## 2016-04-26 HISTORY — DX: Gastro-esophageal reflux disease without esophagitis: K21.9

## 2016-04-26 HISTORY — DX: Abnormal posture: R29.3

## 2016-04-26 HISTORY — DX: Dehydration: E86.0

## 2016-04-26 HISTORY — DX: Essential (primary) hypertension: I10

## 2016-04-26 LAB — BASIC METABOLIC PANEL
Anion gap: 8 (ref 5–15)
BUN: 26 mg/dL — AB (ref 6–20)
CALCIUM: 9.9 mg/dL (ref 8.9–10.3)
CHLORIDE: 107 mmol/L (ref 101–111)
CO2: 28 mmol/L (ref 22–32)
CREATININE: 0.82 mg/dL (ref 0.44–1.00)
GFR calc Af Amer: >60 mL/min (ref >60)
GFR calc non Af Amer: >60 mL/min (ref >60)
Glucose, Bld: 85 mg/dL (ref 65–99)
Potassium: 4.1 mmol/L (ref 3.5–5.1)
SODIUM: 143 mmol/L (ref 135–145)

## 2016-04-26 LAB — URINALYSIS COMPLETE WITH MICROSCOPIC (ARMC ONLY)
BILIRUBIN URINE: NEGATIVE
GLUCOSE, UA: NEGATIVE mg/dL
Ketones, ur: NEGATIVE mg/dL
Nitrite: POSITIVE — AB
Protein, ur: NEGATIVE mg/dL
Specific Gravity, Urine: 1.011 (ref 1.005–1.030)
pH: 6 (ref 5.0–8.0)

## 2016-04-26 LAB — CBC WITH DIFFERENTIAL/PLATELET
Basophils Absolute: 0 10*3/uL (ref 0–0.1)
Basophils Relative: 1 %
EOS ABS: 0.1 10*3/uL (ref 0–0.7)
EOS PCT: 2 %
HCT: 38.7 % (ref 35.0–47.0)
Hemoglobin: 13 g/dL (ref 12.0–16.0)
LYMPHS ABS: 1.1 10*3/uL (ref 1.0–3.6)
LYMPHS PCT: 28 %
MCH: 30 pg (ref 26.0–34.0)
MCHC: 33.5 g/dL (ref 32.0–36.0)
MCV: 89.6 fL (ref 80.0–100.0)
MONO ABS: 0.4 10*3/uL (ref 0.2–0.9)
Monocytes Relative: 10 %
Neutro Abs: 2.4 10*3/uL (ref 1.4–6.5)
Neutrophils Relative %: 59 %
PLATELETS: 226 10*3/uL (ref 150–440)
RBC: 4.32 MIL/uL (ref 3.80–5.20)
RDW: 16.3 % — AB (ref 11.5–14.5)
WBC: 3.9 10*3/uL (ref 3.6–11.0)

## 2016-04-26 LAB — TROPONIN I

## 2016-04-26 MED ORDER — FENTANYL CITRATE (PF) 100 MCG/2ML IJ SOLN
50.0000 ug | Freq: Once | INTRAMUSCULAR | Status: AC
  Administered 2016-04-26: 50 ug via INTRAVENOUS
  Filled 2016-04-26: qty 2

## 2016-04-26 MED ORDER — ACETAMINOPHEN 500 MG PO TABS
500.0000 mg | ORAL_TABLET | Freq: Once | ORAL | Status: AC
  Administered 2016-04-26: 500 mg via ORAL
  Filled 2016-04-26: qty 1

## 2016-04-26 MED ORDER — HYDRALAZINE HCL 20 MG/ML IJ SOLN
10.0000 mg | Freq: Once | INTRAMUSCULAR | Status: AC
  Administered 2016-04-26: 10 mg via INTRAVENOUS
  Filled 2016-04-26: qty 1

## 2016-04-26 MED ORDER — DEXTROSE 5 % IV SOLN
1.0000 g | Freq: Once | INTRAVENOUS | Status: AC
  Administered 2016-04-26: 1 g via INTRAVENOUS
  Filled 2016-04-26: qty 10

## 2016-04-26 MED ORDER — LISINOPRIL 5 MG PO TABS
5.0000 mg | ORAL_TABLET | Freq: Once | ORAL | Status: AC
  Administered 2016-04-26: 5 mg via ORAL
  Filled 2016-04-26: qty 1

## 2016-04-26 MED ORDER — CEPHALEXIN 500 MG PO CAPS: 500.0000 mg | ORAL_CAPSULE | Freq: Two times a day (BID) | ORAL | Status: DC

## 2016-04-26 MED ORDER — AMLODIPINE BESYLATE 5 MG PO TABS: 10.0000 mg | ORAL_TABLET | Freq: Once | ORAL | Status: DC

## 2016-04-26 NOTE — ED Notes (Signed)
Attempt to call report to [facility] without success.

## 2016-04-26 NOTE — ED Notes (Signed)
EMS arrived to transport pt back to [facility] 

## 2016-04-26 NOTE — ED Notes (Signed)
[facility] c/o unwitnessed fall with head involvement. Pt has hematoma to R forehead and small lac 0.5cm above R eyebrow. EMS states pt was found sitting next to bed, possibly struck head on dresser. Pt has hx of dementia, follows commands but unable to answer questions at baseline. Pt nods when asked if she has pain, is holding her head on arrival.

## 2016-04-26 NOTE — ED Notes (Signed)
[facility] (Donivan ScullAmy Allen, Charity fundraiserN). Awaiting transport.

## 2016-04-26 NOTE — ED Notes (Signed)
Updated pt's daughter on status and [facility]

## 2016-04-26 NOTE — ED Notes (Signed)
Pt's next of kin Katha Hammingmma White (daughter, [phone]) updated on pt's status.

## 2016-04-26 NOTE — ED Provider Notes (Signed)
[facility] Emergency Department Provider Note   ____________________________________________  Time seen: Approximately 12:35 I have reviewed the triage vital signs and the triage nursing note.  HISTORY  Chief Complaint Fall and Head Injury   Historian Limited history due to patient's dementia History per nursing home/EMS report  HPI Tonya King is a 82 y.o. female with dementia who was found having fallen out of her wheelchair, apparently head first with hematoma to the right forehead.  Not really verbal at baseline per report, so difficult to assess for other pain complaints.  No reported recent illnesses.  Review of paper nh medication list shows no aspirin or blood thinners.   Past Medical History  Diagnosis Date  . Dementia   . Chronic kidney disease   . Hypertension   . Sequelae of cerebrovascular disease   . Dehydration   . Adult failure to thrive   . Anxiety   . Abnormal posture   . Vitamin D deficiency   . Muscle weakness (generalized)   . Difficulty walking   . GERD (gastroesophageal reflux disease)   Dementia  There are no active problems to display for this patient.   History reviewed. No pertinent past surgical history.  Current Outpatient Rx  Name  Route  Sig  Dispense  Refill  . cephALEXin (KEFLEX) 500 MG capsule   Oral   Take 1 capsule (500 mg total) by mouth 2 (two) times daily.   14 capsule   0     Allergies Review of patient's allergies indicates no known allergies.  History reviewed. No pertinent family history.  Social History Social History  Substance Use Topics  . Smoking status: Never Smoker   . Smokeless tobacco: Never Used  . Alcohol Use: No    Review of Systems Unable to obtain, patient with dementia/nonverbal Constitutional:  Eyes:  ENT:  Cardiovascular:  Respiratory:  Gastrointestinal:  Genitourinary:  Musculoskeletal: Skin:  Neurological:    ____________________________________________   PHYSICAL EXAM:  VITAL SIGNS: ED Triage Vitals  Enc Vitals Group     BP --      Pulse --      Resp --      Temp --      Temp src --      SpO2 --      Weight --      Height --      Head Cir --      Peak Flow --      Pain Score --      Pain Loc --      Pain Edu? --      Excl. in GC? --      Constitutional:Occasionally opening eyes.  Nonverbal, not really verbal HEENT   Head: Large right scalp/forehead hematoma      Eyes: Conjunctivae are normal. PERRL. Normal extraocular movements.      Ears:         Nose: No congestion/rhinnorhea.   Mouth/Throat: Mucous membranes are moist.   Neck: No stridor.  Stiff neck, unable to tell whether or not she's having pain Cardiovascular/Chest: Normal rate, regular rhythm.  No murmurs, rubs, or gallops. Respiratory: Normal respiratory effort without tachypnea nor retractions. Breath sounds are clear and equal bilaterally. No wheezes/rales/rhonchi. Gastrointestinal: Soft. No distention, no guarding, no rebound. No apparent tenderness. Genitourinary/rectal:Deferred Musculoskeletal: Nontender with normal range of motion in all extremities. No joint effusions.  No lower extremity tenderness.  No edema. Neurologic:  Nonverbal.  No facial droop.  Unable to  cooperate with full neuro exam, but moving four extremities on her own.   Skin:  Skin is warm, dry and intact. No rash noted.   ____________________________________________   EKG I, Governor Rooks, MD, the attending physician have personally viewed and interpreted all ECGs.  71bpm  NSR with the underlying baseline. Left axis deviation. Nonspecific T-wave flattening, but very difficult to assess due to underlying baseline static. ____________________________________________  LABS (pertinent positives/negatives)  Labs Reviewed  URINALYSIS COMPLETEWITH MICROSCOPIC ([facility] ONLY) - Abnormal; Notable for the following:    Color, Urine  YELLOW (*)    APPearance HAZY (*)    Hgb urine dipstick 2+ (*)    Nitrite POSITIVE (*)    Leukocytes, UA 1+ (*)    Bacteria, UA MANY (*)    Squamous Epithelial / LPF 0-5 (*)    All other components within normal limits  BASIC METABOLIC PANEL - Abnormal; Notable for the following:    BUN 26 (*)    All other components within normal limits  CBC WITH DIFFERENTIAL/PLATELET - Abnormal; Notable for the following:    RDW 16.3 (*)    All other components within normal limits  URINE CULTURE  TROPONIN I    ____________________________________________  RADIOLOGY All Xrays were viewed by me. Imaging interpreted by Radiologist.  Ct head and c spine without contrast:  IMPRESSION: 1. Moderate to large right frontal scalp hematoma. No evidence of acute intracranial abnormality. No evidence of calvarial fracture. 2. Small fluid level in the visualized right maxillary sinus, nonspecific, favor the sequela of sinusitis given mucoperiosteal thickening in the ethmoidal air cells. 3. Generalized cerebral volume loss and moderate chronic small vessel ischemia. 4. No cervical spine fracture or subluxation. 5. Moderate to severe degenerative changes in the cervical spine as described. __________________________________________  PROCEDURES  Procedure(s) performed: None  Critical Care performed: None  ____________________________________________   ED COURSE / ASSESSMENT AND PLAN  Pertinent labs & imaging results that were available during my care of the patient were reviewed by me and considered in my medical decision making (see chart for details).   With the patient's dementia, uncertain of her baseline, but reported that she is essentially nonverbal and not very communicative at baseline.  In order to evaluate her cervical spine, I did place a c-collar and obtained CT of the neck in addition to obviously obtaining a CT of the head given her clear head trauma on clinical exam with a  large forehead hematoma.  4 trauma standpoint, no intracranial traumatic findings. Cervical spine was negative for trichomoniasis findings. She did not have clinical evidence on exam to suggest trauma to her chest, back, or extremities.  It sounds like the fall may have been unwitnessed, so I did go ahead and check her labs and urine to make sure there is no acute medical condition that could've predisposed her to a fall. This evaluation was reassuring.  Patient be discharged back to her nursing home.  Her blood pressure was found to be elevated, it does not appear that she's been on medications for this on her medication list.  Her daughter did come to the emergency department, and see that she has had a history of hypertension, and she does not recall when she was taken off of blood pressure medications or why. The daughter does not know what medications the patient was on.   When I reviewed the most recent prior note from the fall of 2015, at the podiatrist's office, Dr. Orland Jarred in care and where, at  that point she was reportedly on amlodipine 10 mg daily as well as lisinopril 2.5 mg daily.  Given her hypertension here, and give her dose of lisinopril 5 mg, which the nursing home states was a last dose she was on prior tendon not being on a pressure medication.  It took quite a long time to obtain a urine sample, but the urinalysis is consistent with urinary tract infection. Mentally she is much more awake and alert and in think she is okay for outpatient management. She'll be given a dose of IV Rocephin here followed by urine culture. She'll be discharged home with Keflex by mouth.    CONSULTATIONS:  None  Patient / Family / Caregiver informed of clinical course, medical decision-making process, and agree with plan.   I discussed return precautions, follow-up instructions, and discharged instructions with patient and/or family.   ___________________________________________   FINAL  CLINICAL IMPRESSION(S) / ED DIAGNOSES   Final diagnoses:  Scalp hematoma, initial encounter  Essential hypertension  UTI (lower urinary tract infection)              Note: This dictation was prepared with Dragon dictation. Any transcriptional errors that result from this process are unintentional   Governor Rooksebecca Hatim Homann, MD 04/26/16 [phone]

## 2016-04-26 NOTE — ED Notes (Signed)
C-collar placed.

## 2016-04-26 NOTE — Discharge Instructions (Signed)
You were evaluated for injury after fall and found to have no serious injury, but a large hematoma/bruise to the right scalp/forehead. She may take Tylenol or ibuprofen as needed for pain control.  Return to the emergency department for any worsening condition including altered mental status, fever, chest pain, abdominal pain, or any other symptoms concerning to you.    Facial or Scalp Contusion A facial or scalp contusion is a deep bruise on the face or head. Injuries to the face and head generally cause a lot of swelling, especially around the eyes. Contusions are the result of an injury that caused bleeding under the skin. The contusion may turn blue, purple, or yellow. Minor injuries will give you a painless contusion, but more severe contusions may stay painful and swollen for a few weeks.  CAUSES  A facial or scalp contusion is caused by a blunt injury or trauma to the face or head area.  SIGNS AND SYMPTOMS   Swelling of the injured area.   Discoloration of the injured area.   Tenderness, soreness, or pain in the injured area.  DIAGNOSIS  The diagnosis can be made by taking a medical history and doing a physical exam. An X-ray exam, CT scan, or MRI may be needed to determine if there are any associated injuries, such as broken bones (fractures). TREATMENT  Often, the best treatment for a facial or scalp contusion is applying cold compresses to the injured area. Over-the-counter medicines may also be recommended for pain control.  HOME CARE INSTRUCTIONS   Only take over-the-counter or prescription medicines as directed by your health care provider.   Apply ice to the injured area.   Put ice in a plastic bag.   Place a towel between your skin and the bag.   Leave the ice on for 20 minutes, 2-3 times a day.  SEEK MEDICAL CARE IF:  You have bite problems.   You have pain with chewing.   You are concerned about facial defects. SEEK IMMEDIATE MEDICAL CARE IF:  You  have severe pain or a headache that is not relieved by medicine.   You have unusual sleepiness, confusion, or personality changes.   You throw up (vomit).   You have a persistent nosebleed.   You have double vision or blurred vision.   You have fluid drainage from your nose or ear.   You have difficulty walking or using your arms or legs.  MAKE SURE YOU:   Understand these instructions.  Will watch your condition.  Will get help right away if you are not doing well or get worse.   This information is not intended to replace advice given to you by your health care provider. Make sure you discuss any questions you have with your health care provider.   Document Released: 12/24/2004 Document Revised: 12/07/2014 Document Reviewed: 06/29/2013 Elsevier Interactive Patient Education [employer]2016 [employer]

## 2016-04-29 LAB — URINE CULTURE: Culture: >=100000 — AB

## 2016-04-30 ENCOUNTER — Telehealth: Payer: Self-pay | Admitting: Pharmacist

## 2016-04-30 NOTE — Telephone Encounter (Signed)
[facility]. Called over and spoke with RN who requested we fax over the results. Cx report faxed to [phone] on 04/30/16 @ 1308  Melissa D PembinaMaccia, Pharm.D Clinical Pharmacist

## 2017-08-03 ENCOUNTER — Inpatient Hospital Stay
Admission: EM | Admit: 2017-08-03 | Discharge: 2017-08-06 | DRG: 871 | Disposition: A | Discharge status: Skilled Nursing Facility | Payer: Medicare Other | Attending: Internal Medicine | Admitting: Internal Medicine

## 2017-08-03 ENCOUNTER — Encounter: Payer: Self-pay | Admitting: Emergency Medicine

## 2017-08-03 ENCOUNTER — Emergency Department: Payer: Medicare Other

## 2017-08-03 DIAGNOSIS — R6520 Severe sepsis without septic shock: Secondary | ICD-10-CM | POA: Diagnosis present

## 2017-08-03 DIAGNOSIS — A4151 Sepsis due to Escherichia coli [E. coli]: Secondary | ICD-10-CM | POA: Diagnosis present

## 2017-08-03 DIAGNOSIS — Z7401 Bed confinement status: Secondary | ICD-10-CM

## 2017-08-03 DIAGNOSIS — Z6821 Body mass index (BMI) 21.0-21.9, adult: Secondary | ICD-10-CM | POA: Diagnosis not present

## 2017-08-03 DIAGNOSIS — E43 Unspecified severe protein-calorie malnutrition: Secondary | ICD-10-CM | POA: Diagnosis present

## 2017-08-03 DIAGNOSIS — Z1612 Extended spectrum beta lactamase (ESBL) resistance: Secondary | ICD-10-CM | POA: Diagnosis present

## 2017-08-03 DIAGNOSIS — E860 Dehydration: Secondary | ICD-10-CM | POA: Diagnosis present

## 2017-08-03 DIAGNOSIS — E876 Hypokalemia: Secondary | ICD-10-CM | POA: Diagnosis not present

## 2017-08-03 DIAGNOSIS — Z7189 Other specified counseling: Secondary | ICD-10-CM | POA: Diagnosis not present

## 2017-08-03 DIAGNOSIS — Z87440 Personal history of urinary (tract) infections: Secondary | ICD-10-CM | POA: Diagnosis not present

## 2017-08-03 DIAGNOSIS — G9341 Metabolic encephalopathy: Secondary | ICD-10-CM | POA: Diagnosis present

## 2017-08-03 DIAGNOSIS — D61818 Other pancytopenia: Secondary | ICD-10-CM | POA: Diagnosis present

## 2017-08-03 DIAGNOSIS — Z66 Do not resuscitate: Secondary | ICD-10-CM | POA: Diagnosis present

## 2017-08-03 DIAGNOSIS — D709 Neutropenia, unspecified: Secondary | ICD-10-CM

## 2017-08-03 DIAGNOSIS — H04123 Dry eye syndrome of bilateral lacrimal glands: Secondary | ICD-10-CM | POA: Diagnosis present

## 2017-08-03 DIAGNOSIS — A419 Sepsis, unspecified organism: Secondary | ICD-10-CM

## 2017-08-03 DIAGNOSIS — I10 Essential (primary) hypertension: Secondary | ICD-10-CM | POA: Diagnosis present

## 2017-08-03 DIAGNOSIS — R109 Unspecified abdominal pain: Secondary | ICD-10-CM

## 2017-08-03 DIAGNOSIS — N390 Urinary tract infection, site not specified: Secondary | ICD-10-CM | POA: Diagnosis present

## 2017-08-03 DIAGNOSIS — F0390 Unspecified dementia without behavioral disturbance: Secondary | ICD-10-CM | POA: Diagnosis present

## 2017-08-03 DIAGNOSIS — Z515 Encounter for palliative care: Secondary | ICD-10-CM

## 2017-08-03 DIAGNOSIS — E162 Hypoglycemia, unspecified: Secondary | ICD-10-CM | POA: Diagnosis present

## 2017-08-03 DIAGNOSIS — R4182 Altered mental status, unspecified: Secondary | ICD-10-CM

## 2017-08-03 DIAGNOSIS — E870 Hyperosmolality and hypernatremia: Secondary | ICD-10-CM | POA: Diagnosis present

## 2017-08-03 LAB — CBC WITH DIFFERENTIAL/PLATELET
BASOS ABS: 0 10*3/uL (ref 0–0.1)
BASOS PCT: 1 %
EOS ABS: 0.1 10*3/uL (ref 0–0.7)
Eosinophils Relative: 3 %
HCT: 37.2 % (ref 35.0–47.0)
Hemoglobin: 12.5 g/dL (ref 12.0–16.0)
Lymphocytes Relative: 39 %
Lymphs Abs: 0.9 10*3/uL — ABNORMAL LOW (ref 1.0–3.6)
MCH: 31 pg (ref 26.0–34.0)
MCHC: 33.7 g/dL (ref 32.0–36.0)
MCV: 91.9 fL (ref 80.0–100.0)
MONO ABS: 0.4 10*3/uL (ref 0.2–0.9)
Monocytes Relative: 15 %
Neutro Abs: 1 10*3/uL — ABNORMAL LOW (ref 1.4–6.5)
Neutrophils Relative %: 42 %
Platelets: 168 10*3/uL (ref 150–440)
RBC: 4.04 MIL/uL (ref 3.80–5.20)
RDW: 17.9 % — AB (ref 11.5–14.5)
WBC: 2.4 10*3/uL — ABNORMAL LOW (ref 3.6–11.0)

## 2017-08-03 LAB — BASIC METABOLIC PANEL
ANION GAP: 5 (ref 5–15)
BUN: 30 mg/dL — ABNORMAL HIGH (ref 6–20)
CALCIUM: 9.7 mg/dL (ref 8.9–10.3)
CO2: 31 mmol/L (ref 22–32)
Chloride: 114 mmol/L — ABNORMAL HIGH (ref 101–111)
Creatinine, Ser: 0.75 mg/dL (ref 0.44–1.00)
Glucose, Bld: 82 mg/dL (ref 65–99)
Potassium: 4.4 mmol/L (ref 3.5–5.1)
SODIUM: 150 mmol/L — AB (ref 135–145)

## 2017-08-03 LAB — GLUCOSE, CAPILLARY
GLUCOSE-CAPILLARY: 65 mg/dL (ref 65–99)
GLUCOSE-CAPILLARY: 123 mg/dL — AB (ref 65–99)
GLUCOSE-CAPILLARY: 66 mg/dL (ref 65–99)
GLUCOSE-CAPILLARY: 79 mg/dL (ref 65–99)
Glucose-Capillary: 94 mg/dL (ref 65–99)

## 2017-08-03 LAB — URINALYSIS, COMPLETE (UACMP) WITH MICROSCOPIC
Bilirubin Urine: NEGATIVE
Glucose, UA: NEGATIVE mg/dL
Hgb urine dipstick: NEGATIVE
KETONES UR: NEGATIVE mg/dL
Leukocytes, UA: NEGATIVE
Nitrite: NEGATIVE
PH: 5 (ref 5.0–8.0)
Protein, ur: NEGATIVE mg/dL
SPECIFIC GRAVITY, URINE: 1.021 (ref 1.005–1.030)

## 2017-08-03 LAB — TROPONIN I: Troponin I: <0.03 ng/mL (ref <0.03)

## 2017-08-03 LAB — LACTIC ACID, PLASMA: Lactic Acid, Venous: 0.9 mmol/L (ref 0.5–1.9)

## 2017-08-03 LAB — MAGNESIUM: MAGNESIUM: 2.3 mg/dL (ref 1.7–2.4)

## 2017-08-03 LAB — MRSA PCR SCREENING: MRSA by PCR: NEGATIVE

## 2017-08-03 MED ORDER — PIPERACILLIN-TAZOBACTAM 3.375 G IVPB 30 MIN
3.3750 g | Freq: Once | INTRAVENOUS | Status: AC
  Administered 2017-08-03: 3.375 g via INTRAVENOUS

## 2017-08-03 MED ORDER — ACETAMINOPHEN 325 MG PO TABS: 650.0000 mg | ORAL_TABLET | Freq: Four times a day (QID) | ORAL | Status: DC | PRN

## 2017-08-03 MED ORDER — DEXTROSE 50 % IV SOLN
12.5000 g | Freq: Once | INTRAVENOUS | Status: AC
  Administered 2017-08-03: 12.5 g via INTRAVENOUS

## 2017-08-03 MED ORDER — ALBUTEROL SULFATE (2.5 MG/3ML) 0.083% IN NEBU: 2.5000 mg | INHALATION_SOLUTION | RESPIRATORY_TRACT | Status: DC | PRN

## 2017-08-03 MED ORDER — SODIUM CHLORIDE 0.45 % IV BOLUS
500.0000 mL | Freq: Once | INTRAVENOUS | Status: AC
  Administered 2017-08-03: 500 mL via INTRAVENOUS
  Filled 2017-08-03: qty 500

## 2017-08-03 MED ORDER — PIPERACILLIN-TAZOBACTAM 3.375 G IVPB 30 MIN
INTRAVENOUS | Status: AC
  Administered 2017-08-03: 3.375 g via INTRAVENOUS
  Filled 2017-08-03: qty 50

## 2017-08-03 MED ORDER — ONDANSETRON HCL 4 MG PO TABS: 4.0000 mg | ORAL_TABLET | Freq: Four times a day (QID) | ORAL | Status: DC | PRN

## 2017-08-03 MED ORDER — ONDANSETRON HCL 4 MG/2ML IJ SOLN: 4.0000 mg | Freq: Four times a day (QID) | INTRAMUSCULAR | Status: DC | PRN

## 2017-08-03 MED ORDER — SODIUM CHLORIDE 0.45 % IV SOLN: INTRAVENOUS | Status: DC

## 2017-08-03 MED ORDER — FLEET ENEMA 7-19 GM/118ML RE ENEM: 1.0000 | ENEMA | Freq: Once | RECTAL | Status: DC | PRN

## 2017-08-03 MED ORDER — DEXTROSE-NACL 5-0.45 % IV SOLN
INTRAVENOUS | Status: DC
  Administered 2017-08-03 – 2017-08-06 (×7): via INTRAVENOUS

## 2017-08-03 MED ORDER — ENOXAPARIN SODIUM 40 MG/0.4ML ~~LOC~~ SOLN
40.0000 mg | SUBCUTANEOUS | Status: DC
  Administered 2017-08-03: 40 mg via SUBCUTANEOUS
  Filled 2017-08-03: qty 0.4

## 2017-08-03 MED ORDER — SODIUM CHLORIDE 0.9% FLUSH
3.0000 mL | Freq: Two times a day (BID) | INTRAVENOUS | Status: DC
  Administered 2017-08-03 – 2017-08-06 (×7): 3 mL via INTRAVENOUS

## 2017-08-03 MED ORDER — DEXTROSE 50 % IV SOLN
INTRAVENOUS | Status: AC
Start: 2017-08-03 — End: 2017-08-03
  Administered 2017-08-03: 12.5 g via INTRAVENOUS
  Filled 2017-08-03: qty 50

## 2017-08-03 MED ORDER — INSULIN ASPART 100 UNIT/ML ~~LOC~~ SOLN: 0.0000 [IU] | SUBCUTANEOUS | Status: DC

## 2017-08-03 MED ORDER — ATROPINE SULFATE 1 MG/10ML IJ SOSY
PREFILLED_SYRINGE | INTRAMUSCULAR | Status: AC
  Administered 2017-08-03: 1 mg via INTRAVENOUS
  Filled 2017-08-03: qty 10

## 2017-08-03 MED ORDER — POLYETHYLENE GLYCOL 3350 17 G PO PACK: 17.0000 g | PACK | Freq: Every day | ORAL | Status: DC | PRN

## 2017-08-03 MED ORDER — ATROPINE SULFATE 1 MG/10ML IJ SOSY
1.0000 mg | PREFILLED_SYRINGE | Freq: Once | INTRAMUSCULAR | Status: AC
  Administered 2017-08-03: 1 mg via INTRAVENOUS

## 2017-08-03 MED ORDER — BISACODYL 10 MG RE SUPP
10.0000 mg | Freq: Every day | RECTAL | Status: DC | PRN
  Filled 2017-08-03: qty 1

## 2017-08-03 MED ORDER — SODIUM CHLORIDE 0.9 % IV SOLN
1.0000 g | Freq: Two times a day (BID) | INTRAVENOUS | Status: DC
  Administered 2017-08-03 – 2017-08-05 (×4): 1 g via INTRAVENOUS
  Filled 2017-08-03 (×6): qty 1

## 2017-08-03 MED ORDER — ACETAMINOPHEN 650 MG RE SUPP: 650.0000 mg | Freq: Four times a day (QID) | RECTAL | Status: DC | PRN

## 2017-08-03 NOTE — Progress Notes (Signed)
Pharmacy Antibiotic Note  Tonya King is a 83 y.o. female admitted on 08/03/2017 with UTI.  Pharmacy has been consulted for merrem dosing.  Plan: merrem 1gm iv q12h   Height: 5\' 3"  (160 cm) Weight: 120 lb (54.4 kg) IBW/kg (Calculated) : 52.4  Temp (24hrs), Avg:93.4 F (34.1 C), Min:93.2 F (34 C), Max:93.8 F (34.3 C)   Recent Labs Lab 08/03/17 1053 08/03/17 1126  WBC 2.4*  --   CREATININE 0.75  --   LATICACIDVEN  --  0.9    Estimated Creatinine Clearance: 39.4 mL/min (by C-G formula based on SCr of 0.75 mg/dL).    No Known Allergies  Antimicrobials this admission: 9/4 merrem >>   9/4 BCx: pending x 2  9/4 UCx: pending    Thank you for allowing pharmacy to be a part of this patient's care.  Gerre PebblesGarrett Krystal Teachey 08/03/2017 1:57 PM

## 2017-08-03 NOTE — ED Notes (Signed)
CBG 66. Dr. Elpidio AnisSudini made aware. See new orders.

## 2017-08-03 NOTE — Progress Notes (Signed)
Advance care planning  Met with patient's healthcare power of attorney her daughter at bedside. We discussed regarding UTI, severe sepsis, acute encephalopathy, dementia, dehydration and hypernatremia.  Explained that patient is critically ill. Daughter tells me that she has seen a progressive decline over the past few months. Unfortunately at the nursing home with the staffing issues she feels that she is not cleared for well with her feeding and fluids. Daughter understands the critical nature. Tells me that patient is DO NOT RESUSCITATE and DO NOT INTUBATE. She would like to see patient improves with IV antibiotics and fluids. Transition to comfort measures if no improvement.  Discussed with daughter regarding admitting to the medical floor.  Time spent 20 minutes.

## 2017-08-03 NOTE — ED Triage Notes (Signed)
[facility]. Staff report patient was unresponsive during rounding this morning. States she is normally awake and interactive with staff. Patient HR was found to be in the 30s when vitals were checked. Upon arrival to ED, patient remains unresponsive, only withdrawing from painful stimulation. Patient HR in 30's upon arrival. MD at bedside.

## 2017-08-03 NOTE — ED Provider Notes (Signed)
[facility] Emergency Department Provider Note  ____________________________________________  Time seen: Approximately 10:48 AM  I have reviewed the triage vital signs and the nursing notes.   HISTORY  Chief Complaint Bradycardia and Altered Mental Status  Level 5 caveat:  Portions of the history and physical were unable to be obtained due to ams   HPI Tonya King is a 83 y.o. female with a history of dementia, chronic kidney disease, failure to thrive, HTNwho presents for evaluation of altered mental status. according to EMS patient was restarted on her lisinopril week ago. Yesterday evening she was noted to be hypertensive with systolics in the 200s. They gave her a dose of clonidine and midnight. This morning patient was altered and noted to be bradycardic. According to the skilled nursing facility patient is usually awake and alert and will answer questions but confused at baseline. This is a very drastic change from her baseline. She has had no fevers, no vomiting, no diarrhea. The patient will not answer any questions. She will squeeze my hand with both her hands and wiggle her toes but will not follow any other commands.  Past Medical History:  Diagnosis Date  . Abnormal posture   . Adult failure to thrive   . Anxiety   . Chronic kidney disease   . Dehydration   . Dementia   . Difficulty walking   . GERD (gastroesophageal reflux disease)   . Hypertension   . Muscle weakness (generalized)   . Sequelae of cerebrovascular disease   . Vitamin D deficiency     There are no active problems to display for this patient.   History reviewed. No pertinent surgical history.  Prior to Admission medications   Medication Sig Start Date End Date Taking? Authorizing Provider  acetaminophen (TYLENOL) 500 MG tablet Take 1,000 mg by mouth 2 (two) times daily.   Yes [provider]  cholecalciferol (VITAMIN D) 1000 units tablet Take 1,000 Units by  mouth daily.   Yes [provider]  lactulose (CHRONULAC) 10 GM/15ML solution Take 20 g by mouth at bedtime.   Yes [provider]  lisinopril (PRINIVIL,ZESTRIL) 5 MG tablet Take 5 mg by mouth daily.   Yes [provider]  mirtazapine (REMERON) 15 MG tablet Take 15 mg by mouth at bedtime.   Yes [provider]  Propylene Glycol 0.6 % SOLN Place 1 drop into both eyes at bedtime.   Yes [provider]  vitamin B-12 (CYANOCOBALAMIN) 1000 MCG tablet Take 1,000 mcg by mouth every other day.   Yes [provider]    Allergies Patient has no known allergies.  No family history on file.  Social History Social History  Substance Use Topics  . Smoking status: Never Smoker  . Smokeless tobacco: Never Used  . Alcohol use No    Review of Systems  Constitutional: Negative for fever. Gastrointestinal: Negative for vomiting or diarrhea.   Level 5 caveat:  Portions of the history and physical were unable to be obtained due to ams  ____________________________________________   PHYSICAL EXAM:  VITAL SIGNS: ED Triage Vitals  Enc Vitals Group     BP 08/03/17 1033 (!) 190/82     Pulse Rate 08/03/17 1033 (!) 37     Resp 08/03/17 1033 12     Temp --      Temp src --      SpO2 08/03/17 1033 94 %     Weight 08/03/17 1045 120 lb (54.4 kg)  Height 08/03/17 1045 5\' 3"  (1.6 m)     Head Circumference --      Peak Flow --      Pain Score --      Pain Loc --      Pain Edu? --      Excl. in GC? --     Constitutional: Eyes closed, will squeeze my hands with both her hands and wiggle her toes, does not follow any other commands, does not open her eyes, but does not answer any questions, in no distress HEENT:      Head: Normocephalic and atraumatic.         Eyes: Conjunctivae are normal. Sclera is non-icteric. PERRL      Mouth/Throat: Mucous membranes are dry.       Neck: Supple with no signs of meningismus. Cardiovascular: bradycardic  with regular rhythm, no murmurs or gallops Respiratory: Normal respiratory effort. Lungs are clear to auscultation bilaterally. No wheezes, crackles, or rhonchi.  Gastrointestinal: Soft, non tender, and non distended with positive bowel sounds. No rebound or guarding. Musculoskeletal: No edema, cyanosis, or erythema of extremities. Neurologic: squeezes my hand with both her hands, wiggle her toes, nonverbal, does not open her eyes, and is not following other commands, will fight me when I attempt to open her eyes. Skin: Skin is warm, dry and intact. No rash noted. Psychiatric: Mood and affect are normal. Speech and behavior are normal.  ____________________________________________   LABS (all labs ordered are listed, but only abnormal results are displayed)  Labs Reviewed  CBC WITH DIFFERENTIAL/PLATELET - Abnormal; Notable for the following:       Result Value   WBC 2.4 (*)    RDW 17.9 (*)    Neutro Abs 1.0 (*)    Lymphs Abs 0.9 (*)    All other components within normal limits  BASIC METABOLIC PANEL - Abnormal; Notable for the following:    Sodium 150 (*)    Chloride 114 (*)    BUN 30 (*)    All other components within normal limits  URINALYSIS, COMPLETE (UACMP) WITH MICROSCOPIC - Abnormal; Notable for the following:    Color, Urine YELLOW (*)    APPearance HAZY (*)    Bacteria, UA MANY (*)    Squamous Epithelial / LPF 0-5 (*)    All other components within normal limits  CULTURE, BLOOD (ROUTINE X 2)  CULTURE, BLOOD (ROUTINE X 2)  URINE CULTURE  MAGNESIUM  TROPONIN I  GLUCOSE, CAPILLARY  LACTIC ACID, PLASMA   ____________________________________________  EKG  ED ECG REPORT I, Nita Sickle, the attending physician, personally viewed and interpreted this ECG.  Sinus bradycardia, rate of 33, prolonged QTC, left axis deviation, T-wave inversions on inferior lateral leads, no ST elevation. New from prior   ____________________________________________  RADIOLOGY  CXR: Negative ____________________________________________   PROCEDURES  Procedure(s) performed: None Procedures Critical Care performed: yes  CRITICAL CARE Performed by: Nita Sickle  ?  Total critical care time: 40 min  Critical care time was exclusive of separately billable procedures and treating other patients.  Critical care was necessary to treat or prevent imminent or life-threatening deterioration.  Critical care was time spent personally by me on the following activities: development of treatment plan with patient and/or surrogate as well as nursing, discussions with consultants, evaluation of patient's response to treatment, examination of patient, obtaining history from patient or surrogate, ordering and performing treatments and interventions, ordering and review of laboratory studies, ordering and review of radiographic studies,  pulse oximetry and re-evaluation of patient's condition.  ____________________________________________   INITIAL IMPRESSION / ASSESSMENT AND PLAN / ED COURSE  [age] y.o. female with a history of dementia, chronic kidney disease, failure to thrive, HTNwho presents for evaluation of altered mental status and bradycardia. patient found to be hypoglycemic, hypothermic, and bradycardic concerning for sepsis. UA positive for urinary tract infection for which she was given Zosyn. Patient was given half an amp of D50. She'll be given 500 cc of half normal saline for hypernatremia with a sodium of 150. She received 1 mg of atropine 2 with improvement of her heart rate from the low 30s to the upper 40s. Blood pressure has been stable.Bear hugger was placed to help with hypothermia. Patient is also leukopenic with an absolute neutrophil count 1.0. She is to be admitted to the hospitalist service for further management. Daughter is at the bedside and was updated on patient's critical  condition.     Pertinent labs & imaging results that were available during my care of the patient were reviewed by me and considered in my medical decision making (see chart for details).    ____________________________________________   FINAL CLINICAL IMPRESSION(S) / ED DIAGNOSES  Final diagnoses:  Altered mental status, unspecified altered mental status type  Sepsis, due to unspecified organism (HCC)  Hypoglycemia  Neutropenia, unspecified type (HCC)      NEW MEDICATIONS STARTED DURING THIS VISIT:  New Prescriptions   No medications on file     Note:  This document was prepared using Dragon voice recognition software and may include unintentional dictation errors.    Don PerkingVeronese, [place], MD 08/03/17 [phone]

## 2017-08-03 NOTE — H&P (Signed)
[facility] [facility] [place] [facility]   PATIENT NAME: Tonya King    MR#:  [id number]  DATE OF BIRTH:  May 27, 1926  DATE OF ADMISSION:  08/03/2017  PRIMARY CARE PHYSICIAN: Justin Mend, MD   REQUESTING/REFERRING PHYSICIAN: Dr. Don Perking  CHIEF COMPLAINT:   Chief Complaint  Patient presents with  . Bradycardia  . Altered Mental Status    HISTORY OF PRESENT ILLNESS:  Tonya King  is a 83 y.o. female with a known history of Dementia, bedbound status, severe protein calorie malnutrition, recurrent UTIs presents to the emergency room sent in from the nursing home due to worsening mental status. History obtained from daughter at bedside and reviewing old records. Patient is bedbound at baseline. Needs help with feeding. Daughter makes it a point to visit her daily to feed her and hydrate her with water. She has been out of town on vacation for a week and return to find her with worsened status. In the emergency room patient has been found to have a UTI, hypothermia, bradycardia. She was hypertensive yesterday night received a dose of clonidine. According to the daughter patient has had fluctuating blood pressures of significantly elevated and low blood pressures.  PAST MEDICAL HISTORY:   Past Medical History:  Diagnosis Date  . Abnormal posture   . Adult failure to thrive   . Anxiety   . Chronic kidney disease   . Dehydration   . Dementia   . Difficulty walking   . GERD (gastroesophageal reflux disease)   . Hypertension   . Muscle weakness (generalized)   . Sequelae of cerebrovascular disease   . Vitamin D deficiency     PAST SURGICAL HISTORY:  History reviewed. No pertinent surgical history.  SOCIAL HISTORY:   Social History  Substance Use Topics  . Smoking status: Never Smoker  . Smokeless tobacco: Never Used  . Alcohol use No    FAMILY HISTORY:  No family history on file. Cannot obtain history  DRUG ALLERGIES:  No Known  Allergies  REVIEW OF SYSTEMS:   Review of Systems  Unable to perform ROS: Mental status change    MEDICATIONS AT HOME:   Prior to Admission medications   Medication Sig Start Date End Date Taking? Authorizing Provider  acetaminophen (TYLENOL) 500 MG tablet Take 1,000 mg by mouth 2 (two) times daily.   Yes [provider]  cholecalciferol (VITAMIN D) 1000 units tablet Take 1,000 Units by mouth daily.   Yes [provider]  lactulose (CHRONULAC) 10 GM/15ML solution Take 20 g by mouth at bedtime.   Yes [provider]  lisinopril (PRINIVIL,ZESTRIL) 5 MG tablet Take 5 mg by mouth daily.   Yes [provider]  mirtazapine (REMERON) 15 MG tablet Take 15 mg by mouth at bedtime.   Yes [provider]  Propylene Glycol 0.6 % SOLN Place 1 drop into both eyes at bedtime.   Yes [provider]  vitamin B-12 (CYANOCOBALAMIN) 1000 MCG tablet Take 1,000 mcg by mouth every other day.   Yes [provider]     VITAL SIGNS:  Blood pressure 139/74, pulse (!) 42, temperature (!) 93.8 F (34.3 C), resp. rate 11, height 5\' 3"  (1.6 m), weight 54.4 kg (120 lb), SpO2 100 %.  PHYSICAL EXAMINATION:  Physical Exam  GENERAL:  83 y.o.-year-old patient lying in the bed, Unresponsive EYES: Pupils equal, round, reactive to light and accommodation. No scleral icterus. Extraocular muscles intact.  HEENT: Head atraumatic, normocephalic. Oropharynx and nasopharynx  clear. No oropharyngeal erythema, moist oral dry NECK:  Supple, no jugular venous distention. No thyroid enlargement, no tenderness.  LUNGS: Normal breath sounds bilaterally, no wheezing, rales, rhonchi. No use of accessory muscles of respiration.  CARDIOVASCULAR: S1, S2 , bradycardia ABDOMEN: Soft, nontender, nondistended. Bowel sounds present. No organomegaly or mass.  EXTREMITIES: No pedal edema, cyanosis, or clubbing. + 2 pedal & radial pulses b/l.   NEUROLOGIC: Rigidity in upper  extremities. Withdraws to painful stimuli. PSYCHIATRIC: The patient is drowsy  LABORATORY PANEL:   CBC  Recent Labs Lab 08/03/17 1053  WBC 2.4*  HGB 12.5  HCT 37.2  PLT 168   ------------------------------------------------------------------------------------------------------------------  Chemistries   Recent Labs Lab 08/03/17 1053  NA 150*  K 4.4  CL 114*  CO2 31  GLUCOSE 82  BUN 30*  CREATININE 0.75  CALCIUM 9.7  MG 2.3   ------------------------------------------------------------------------------------------------------------------  Cardiac Enzymes  Recent Labs Lab 08/03/17 1053  TROPONINI <0.03   ------------------------------------------------------------------------------------------------------------------  RADIOLOGY:  Dg Abd 1 View  Result Date: 08/03/2017 CLINICAL DATA:  Abdominal pain EXAM: ABDOMEN - 1 VIEW COMPARISON:  CT abdomen/ pelvis dated 08/03/2017 FINDINGS: Moderate colonic stool burden, particularly in the rectum. No evidence of small bowel obstruction. Degenerative changes of the visualized thoracolumbar spine with postsurgical changes at L3-4. Surgical clips overlying the bilateral pelvis. IMPRESSION: Moderate rectal stool burden. Electronically Signed   By: Charline BillsSriyesh  Krishnan M.D.   On: 08/03/2017 13:16   Ct Head Wo Contrast  Result Date: 08/03/2017 CLINICAL DATA:  The patient had an episode of unresponsiveness this morning. No known injury. EXAM: CT HEAD WITHOUT CONTRAST TECHNIQUE: Contiguous axial images were obtained from the base of the skull through the vertex without intravenous contrast. COMPARISON:  Head CT scan 04/26/2016. FINDINGS: Brain: Cortical atrophy and chronic microvascular ischemic change are identified. No evidence of acute abnormality including hemorrhage, infarct, mass lesion, mass effect, midline shift or abnormal extra-axial fluid collection. No hydrocephalus or pneumocephalus. Vascular: Atherosclerosis noted. Skull:  Intact. Sinuses/Orbits: Negative. Other: None. IMPRESSION: No acute abnormality. Atrophy and chronic microvascular ischemic change. Electronically Signed   By: Drusilla Kannerhomas  Dalessio M.D.   On: 08/03/2017 12:40   Dg Chest Portable 1 View  Result Date: 08/03/2017 CLINICAL DATA:  Unresponsive. EXAM: PORTABLE CHEST 1 VIEW COMPARISON:  03/06/2014 FINDINGS: 1103 hours. Lucency under the right hemidiaphragm raises the question of intraperitoneal free air. Lungs are clear. The cardio pericardial silhouette is enlarged. Opacity overlying the medial right apex is stable and likely related to ectatic vascular anatomy. Bones are diffusely demineralized. Telemetry leads overlie the chest. IMPRESSION: 1. Lucency under the right hemidiaphragm raises the question of intraperitoneal free air. Acute abdomen series recommended to further evaluate. 2. Cardiomegaly without acute cardiopulmonary findings. Electronically Signed   By: Kennith CenterEric  Mansell M.D.   On: 08/03/2017 11:46   Ct Renal Stone Study  Result Date: 08/03/2017 CLINICAL DATA:  Unresponsive with bradycardia. EXAM: CT ABDOMEN AND PELVIS WITHOUT CONTRAST TECHNIQUE: Multidetector CT imaging of the abdomen and pelvis was performed following the standard protocol without IV contrast. COMPARISON:  No comparison studies available. FINDINGS: Lower chest: Heart is enlarged. Hepatobiliary: Multiple low-density liver lesions are likely cysts. There is no evidence for gallstones, gallbladder wall thickening, or pericholecystic fluid. No intrahepatic or extrahepatic biliary dilation. Pancreas: No focal mass lesion. No dilatation of the main duct. No intraparenchymal cyst. No peripancreatic edema. Spleen: No splenomegaly. No focal mass lesion. Adrenals/Urinary Tract: No adrenal nodule or mass. Bilateral renal cysts are evident measuring up to 7.6 cm  in the right kidney and 4.1 cm in the left kidney. Multiple smaller cysts are seen in the kidneys bilaterally. Several small lesions in each  kidney cannot be definitively characterize. No hydronephrosis. No evidence for hydroureter. Foley catheter noted in the urinary bladder. Stomach/Bowel: Stomach is nondistended. No gastric wall thickening. No evidence of outlet obstruction. Duodenum is normally positioned as is the ligament of Treitz. No evidence for small bowel obstruction. Diverticuli are seen scattered along the entire length of the colon without CT findings of diverticulitis. Formed stool is seen in the sigmoid colon in there is an extremely large amount of stool in the distal sigmoid colon and rectum. Stool-filled rectum measures up to 10.1 x 9.2 cm in maximum orthogonal diameters on axial imaging. Vascular/Lymphatic: There is abdominal aortic atherosclerosis without aneurysm. There is no gastrohepatic or hepatoduodenal ligament lymphadenopathy. No intraperitoneal or retroperitoneal lymphadenopathy. No pelvic sidewall lymphadenopathy. Reproductive: Uterus surgically absent.  There is no adnexal mass. Other: No intraperitoneal free fluid. Musculoskeletal: Soft tissue attenuation in the subcutaneous fat of the left posterior lower pelvis, posterior to the ischial tuberosity, raises the question of sacral decubitus ulcer. Bones are diffusely demineralized. Status post lower lumbar fusion. Age indeterminate compression fractures are evident at T12 and L3. IMPRESSION: 1. Markedly distended stool-filled rectum measuring 10.1 x 9.2 cm and maximum orthogonal diameters. Imaging features raise concern for fecal impaction. 2. Hepatic and renal cysts. Some small renal lesions on today's study cannot be definitively characterized. 3. Diffuse colonic diverticulosis without diverticulitis. 4.  Aortic Atherosclerois (ICD10-170.0) Electronically Signed   By: Kennith Center M.D.   On: 08/03/2017 12:44     IMPRESSION AND PLAN:   * Severe sepsis secondary to UTI with leukopenia/hypothermia/ acute encephalopathy We'll start IV meropenem due to history of ESBL  UTI. Urine cultures and blood cultures sent and pending. We will bolus IV fluids and started on maintenance fluids. Patient is critically ill. Discussed with daughter regarding this. Plan is to continue IV antibiotics and fluids to tomorrow and transitioned to comfort measures if no improvement.  *  Hypernatremia with severe dehydration due to dementia and decreased oral intake On half-normal saline. Monitor input and output. Repeat labs in the morning  * Acute encephalopathy over dementia due to UTI and dehydration.  * Sinus bradycardia likely due to hyponatremia from sepsis. She also received a dose of clonidine yesterday night which could be contributing. Patient is DNR/DNI. No aggressive measures plan. No pacemaker.  * Hypothermia due to sepsis. On a bear hugger.  * DVT prophylaxis with Lovenox  Patient at this time is being treated with IV antibiotics and fluids without any escalation of care. Likely transition to comfort measures tomorrow if no improvement  All the records are reviewed and case discussed with ED provider. Management plans discussed with the patient, family and they are in agreement.  CODE STATUS: DNR  TOTAL TIME TAKING CARE OF THIS PATIENT: 40 minutes.   Milagros Loll R M.D on 08/03/2017 at 1:48 PM  Between 7am to 6pm - Pager - [phone]  After 6pm go to [email or web address] - password [email or web address] [facility]  [facility] [place] [facility]  Office  [phone]  CC: Primary care physician; Durenda Hurt, Flossie Buffy, MD  Note: This dictation was prepared with Dragon dictation along with smaller phrase technology. Any transcriptional errors that result from this process are unintentional.

## 2017-08-04 DIAGNOSIS — F0390 Unspecified dementia without behavioral disturbance: Secondary | ICD-10-CM

## 2017-08-04 DIAGNOSIS — Z7189 Other specified counseling: Secondary | ICD-10-CM

## 2017-08-04 DIAGNOSIS — Z515 Encounter for palliative care: Secondary | ICD-10-CM

## 2017-08-04 DIAGNOSIS — E860 Dehydration: Secondary | ICD-10-CM

## 2017-08-04 LAB — CBC
HEMATOCRIT: 35.4 % (ref 35.0–47.0)
Hemoglobin: 11.8 g/dL — ABNORMAL LOW (ref 12.0–16.0)
MCH: 31.4 pg (ref 26.0–34.0)
MCHC: 33.5 g/dL (ref 32.0–36.0)
MCV: 93.7 fL (ref 80.0–100.0)
Platelets: 144 10*3/uL — ABNORMAL LOW (ref 150–440)
RBC: 3.78 MIL/uL — ABNORMAL LOW (ref 3.80–5.20)
RDW: 17.4 % — AB (ref 11.5–14.5)
WBC: 2.5 10*3/uL — AB (ref 3.6–11.0)

## 2017-08-04 LAB — BLOOD CULTURE ID PANEL (REFLEXED)
ACINETOBACTER BAUMANNII: NOT DETECTED
CANDIDA ALBICANS: NOT DETECTED
CANDIDA PARAPSILOSIS: NOT DETECTED
Candida glabrata: NOT DETECTED
Candida krusei: NOT DETECTED
Candida tropicalis: NOT DETECTED
ENTEROBACTERIACEAE SPECIES: NOT DETECTED
ESCHERICHIA COLI: NOT DETECTED
Enterobacter cloacae complex: NOT DETECTED
Enterococcus species: NOT DETECTED
HAEMOPHILUS INFLUENZAE: NOT DETECTED
KLEBSIELLA OXYTOCA: NOT DETECTED
Klebsiella pneumoniae: NOT DETECTED
Listeria monocytogenes: NOT DETECTED
METHICILLIN RESISTANCE: NOT DETECTED
Neisseria meningitidis: NOT DETECTED
PSEUDOMONAS AERUGINOSA: NOT DETECTED
Proteus species: NOT DETECTED
STREPTOCOCCUS PNEUMONIAE: NOT DETECTED
STREPTOCOCCUS PYOGENES: NOT DETECTED
Serratia marcescens: NOT DETECTED
Staphylococcus aureus (BCID): NOT DETECTED
Staphylococcus species: DETECTED — AB
Streptococcus agalactiae: NOT DETECTED
Streptococcus species: NOT DETECTED

## 2017-08-04 LAB — BASIC METABOLIC PANEL
ANION GAP: 4 — AB (ref 5–15)
BUN: 23 mg/dL — AB (ref 6–20)
CO2: 27 mmol/L (ref 22–32)
Calcium: 9 mg/dL (ref 8.9–10.3)
Chloride: 114 mmol/L — ABNORMAL HIGH (ref 101–111)
Creatinine, Ser: 0.79 mg/dL (ref 0.44–1.00)
Glucose, Bld: 110 mg/dL — ABNORMAL HIGH (ref 65–99)
POTASSIUM: 3.7 mmol/L (ref 3.5–5.1)
SODIUM: 145 mmol/L (ref 135–145)

## 2017-08-04 LAB — GLUCOSE, CAPILLARY
GLUCOSE-CAPILLARY: 111 mg/dL — AB (ref 65–99)
GLUCOSE-CAPILLARY: 98 mg/dL (ref 65–99)
Glucose-Capillary: 105 mg/dL — ABNORMAL HIGH (ref 65–99)
Glucose-Capillary: 105 mg/dL — ABNORMAL HIGH (ref 65–99)
Glucose-Capillary: 93 mg/dL (ref 65–99)
Glucose-Capillary: 95 mg/dL (ref 65–99)

## 2017-08-04 MED ORDER — BOOST / RESOURCE BREEZE PO LIQD: 1.0000 | Freq: Three times a day (TID) | ORAL | Status: DC

## 2017-08-04 MED ORDER — ENSURE ENLIVE PO LIQD
237.0000 mL | Freq: Two times a day (BID) | ORAL | Status: DC
  Administered 2017-08-05 – 2017-08-06 (×3): 237 mL via ORAL

## 2017-08-04 NOTE — Progress Notes (Signed)
1        [facility]   PATIENT NAME: Tonya King    MR#:  [id number]  DATE OF BIRTH:  09-29-1926  SUBJECTIVE:  CHIEF COMPLAINT:   Chief Complaint  Patient presents with  . Bradycardia  . Altered Mental Status  Mumbling and minimal talking, daughter at bedside REVIEW OF SYSTEMS:  Review of Systems  Unable to perform ROS: Dementia    DRUG ALLERGIES:  No Known Allergies VITALS:  Blood pressure 126/73, pulse (!) 54, temperature (!) 97.5 F (36.4 C), temperature source Oral, resp. rate 18, height 5\' 3"  (1.6 m), weight 54.2 kg (119 lb 6.4 oz), SpO2 100 %. PHYSICAL EXAMINATION:  Physical Exam  Constitutional: Vital signs are normal. She appears malnourished and dehydrated. She appears unhealthy. She appears toxic. She has a sickly appearance.  HENT:  Head: Normocephalic and atraumatic.  Eyes: Pupils are equal, round, and reactive to light. Conjunctivae and EOM are normal.  Neck: Normal range of motion. Neck supple. No tracheal deviation present. No thyromegaly present.  Cardiovascular: Normal rate, regular rhythm and normal heart sounds.   Pulmonary/Chest: Effort normal and breath sounds normal. No respiratory distress. She has no wheezes. She exhibits no tenderness.  Abdominal: Soft. Bowel sounds are normal. She exhibits no distension. There is no tenderness.  Musculoskeletal: Normal range of motion.  Neurological: She is alert. She is disoriented. No cranial nerve deficit.  Skin: Skin is warm and dry. No rash noted.  Psychiatric: Mood and affect normal.   LABORATORY PANEL:  Female CBC  Recent Labs Lab 08/04/17 0452  WBC 2.5*  HGB 11.8*  HCT 35.4  PLT 144*   ------------------------------------------------------------------------------------------------------------------ Chemistries   Recent Labs Lab 08/03/17 1053 08/04/17 0452  NA 150* 145  K 4.4 3.7  CL 114* 114*  CO2 31 27  GLUCOSE 82 110*  BUN 30* 23*    CREATININE 0.75 0.79  CALCIUM 9.7 9.0  MG 2.3  --    RADIOLOGY:  No results found. ASSESSMENT AND PLAN:   * Severe sepsis secondary to UTI with leukopenia/hypothermia/ acute encephalopathy -Continue IV meropenem due to history of ESBL UTI. Urine cultures growing E. coli and blood cultures growing staph.  - c/s ID  *  Hypernatremia with severe dehydration due to dementia and decreased oral intake -Resolved with hydration  * Acute metabolic encephalopathy over dementia due to UTI and dehydration.  * Sinus bradycardia likely due to sepsis. -Monitor Patient is DNR/DNI. No aggressive measures plan. No pacemaker.  * Hypothermia due to sepsis. On a bear hugger.  *Pancytopenia: Due to sepsis  * DVT prophylaxis with Lovenox     All the records are reviewed and case discussed with Care Management/Social Worker. Management plans discussed with the patient, family (daughter at bedside) and they are in agreement.  CODE STATUS: DNR  TOTAL TIME TAKING CARE OF THIS PATIENT: 35 minutes.   More than 50% of the time was spent in counseling/coordination of care: YES  POSSIBLE D/C IN 2-3 DAYS, DEPENDING ON CLINICAL CONDITION.   Delfino LovettVipul Chanda Laperle M.D on 08/04/2017 at 2:56 PM  Between 7am to 6pm - Pager - [phone]  After 6pm go to [email or web address] - Social research officer, governmentpassword EPAS [facility]  [facility]  Office  [phone]  CC: Primary care physician; Durenda HurtArnaez Zapata, Flossie BuffyGerardo E, MD  Note: This dictation was prepared with Dragon dictation along with smaller phrase technology. Any transcriptional errors that result from this process are unintentional.

## 2017-08-04 NOTE — Clinical Social Work Note (Signed)
Clinical Social Work Assessment  Patient Details  Name: Tonya King MRN: [id number] Date of Birth: [birth year]  Date of referral:  08/04/17               Reason for consult:  Facility Placement                Permission sought to share information with:  Family Supports, Magazine features editoracility Contact Representative Permission gErroll Lunaranted to share information::  Yes, Verbal Permission Granted  Name::     Tonya HammingWhite,[name] [phone]  [phone]   Agency::  SNF admissions  Relationship::     Contact Information:     Housing/Transportation Living arrangements for the past 2 months:  Skilled Nursing Facility Source of Information:  Adult Children Patient Interpreter Needed:  None Criminal Activity/Legal Involvement Pertinent to Current Situation/Hospitalization:  No - Comment as needed Significant Relationships:  Adult Children Lives with:  Facility Resident Do you feel safe going back to the place where you live?  Yes Need for family participation in patient care:  Yes (Comment)  Care giving concerns:  Patient's family does not have any concerns about patient returning back [facility].   Social Worker assessment / plan:  Patient is a 83 year old female who is a [facility].  Patient has dementia and alzheimer's, patient's daughter was at bedside and assessment was completed by speaking with her.  Patient's daughter states [facility] since her dementia has gotten worse.  Patient's daughter states overall things are going okay at [facility], she has had a few issues, but she is working with administration to work on the problems.  Patient used to be able to talk and eat independently, but her dementia has increased and she is not able to do very much on her own anymore.  Patient's daughter states she is the only family member who is involved in patient's care and she sees her everyday, sometimes twice a day.  Patient's  daughter expressed that she is hopeful that the issues at SNF can be resolved.  Patient's daughter was explained role of CSW and explained process for helping patient return back to [facility].  Patient's daughter did not express any other questions or concerns.  Patient's daughter gave CSW permission to send information [facility].  Employment status:  Retired Database administratornsurance information:  Managed Medicare PT Recommendations:  Not assessed at this time Information / Referral to community resources:  Skilled Nursing Facility  Patient/Family's Response to care:  Patient's family is agreeable to having patient return back to SNF.  Patient/Family's Understanding of and Emotional Response to Diagnosis, Current Treatment, and Prognosis:  Patient's daughter is hopeful that the issues at SNF can be resolved.  Emotional Assessment Appearance:  Appears stated age Attitude/Demeanor/Rapport:    Affect (typically observed):  Calm Orientation:  Oriented to Self Alcohol / Substance use:  Not Applicable Psych involvement (Current and /or in the community):  No (Comment)  Discharge Needs  Concerns to be addressed:  Care Coordination Readmission within the last 30 days:  No Current discharge risk:  None Barriers to Discharge:  No Barriers Identified   Darleene Cleavernterhaus, Shaylin Blatt R, LCSWA 08/04/2017, 1:57 PM

## 2017-08-04 NOTE — NC FL2 (Signed)
[place] MEDICAID FL2 LEVEL OF CARE SCREENING TOOL     IDENTIFICATION  Patient Name: Tonya King Birthdate: Mar 06, 1926 Sex: female Admission Date (Current Location): 08/03/2017  [place] and [place] Number:  Chiropodist and Address:  [facility], [address], [place], [place] [address]      Provider Number: [id number]  Attending Physician Name and Address:  Delfino Lovett, MD  Relative Name and Phone Number:       Current Level of Care: Hospital Recommended Level of Care: Skilled Nursing Facility Prior Approval Number:    Date Approved/Denied:   PASRR Number: [id number]  Discharge Plan: SNF    Current Diagnoses: Patient Active Problem List   Diagnosis Date Noted  . Hypernatremia 08/03/2017  . DNR (do not resuscitate) 08/03/2017  . Palliative care encounter 08/03/2017  . Dehydration 08/03/2017    Orientation RESPIRATION BLADDER Height & Weight      (Unable to assess; [place])  Normal Indwelling catheter Weight: 119 lb 6.4 oz (54.2 kg) Height:  5\' 3"  (160 cm)  BEHAVIORAL SYMPTOMS/MOOD NEUROLOGICAL BOWEL NUTRITION STATUS      Continent Diet (NPO time specified )  AMBULATORY STATUS COMMUNICATION OF NEEDS Skin   Extensive Assist Non-Verbally [facility] but responds to voice) Normal                       Personal Care Assistance Level of Assistance  Bathing, Feeding, Dressing Bathing Assistance: Limited assistance Feeding assistance: Independent Dressing Assistance: Limited assistance     Functional Limitations Info  Sight, Hearing, Speech Sight Info: Adequate Hearing Info: Adequate Speech Info: Impaired ([place])    SPECIAL CARE FACTORS FREQUENCY  PT (By licensed PT), OT (By licensed OT)     PT Frequency: 5x OT Frequency: 5x            Contractures Contractures Info: Not present    Additional Factors Info  Code Status, Allergies, Insulin Sliding Scale Code Status Info: DNR Allergies Info: No Known Allergies    Insulin Sliding Scale Info: See MAR       Current Medications (08/04/2017):  This is the current hospital active medication list Current Facility-Administered Medications  Medication Dose Route Frequency Provider Last Rate Last Dose  . acetaminophen (TYLENOL) tablet 650 mg  650 mg Oral Q6H PRN Milagros Loll, MD       Or  . acetaminophen (TYLENOL) suppository 650 mg  650 mg Rectal Q6H PRN Sudini, Srikar, MD      . albuterol (PROVENTIL) (2.5 MG/3ML) 0.083% nebulizer solution 2.5 mg  2.5 mg Nebulization Q2H PRN Sudini, Srikar, MD      . bisacodyl (DULCOLAX) suppository 10 mg  10 mg Rectal Daily PRN Sudini, Wardell [place], MD      . dextrose 5 %-0.45 % sodium chloride infusion   Intravenous Continuous Milagros Loll, MD 100 mL/hr at 08/04/17 0837    . enoxaparin (LOVENOX) injection 40 mg  40 mg Subcutaneous Q24H Milagros Loll, MD   40 mg at 08/03/17 1604  . insulin aspart (novoLOG) injection 0-9 Units  0-9 Units Subcutaneous Q4H Sudini, Srikar, MD      . meropenem (MERREM) 1 g in sodium chloride 0.9 % 100 mL IVPB  1 g Intravenous Q12H Coffee, Gerre Pebbles, [facility]   Stopped at 08/04/17 0315  . ondansetron (ZOFRAN) tablet 4 mg  4 mg Oral Q6H PRN Sudini, Wardell [place], MD       Or  . ondansetron (ZOFRAN) injection 4 mg  4 mg Intravenous Q6H  PRN Milagros LollSudini, Srikar, MD      . polyethylene glycol (MIRALAX / GLYCOLAX) packet 17 g  17 g Oral Daily PRN Sudini, Srikar, MD      . sodium chloride flush (NS) 0.9 % injection 3 mL  3 mL Intravenous Q12H Milagros LollSudini, Srikar, MD   3 mL at 08/03/17 2138  . sodium phosphate (FLEET) 7-19 GM/118ML enema 1 enema  1 enema Rectal Once PRN Milagros LollSudini, Srikar, MD         Discharge Medications: Please see discharge summary for a list of discharge medications.  Relevant Imaging Results:  Relevant Lab Results:   Additional Information SSN: [id number]  Dominic PeaJeneya G Elgie Landino, LCSW

## 2017-08-04 NOTE — Progress Notes (Signed)
Initial Nutrition Assessment  DOCUMENTATION CODES:   Severe malnutrition in context of chronic illness  INTERVENTION:  Provide Ensure Enlive po BID, each supplement provides 350 kcal and 20 grams of protein.  Per patient's daughter she will require assistance with all meals and oral nutrition supplements.  Will monitor outcome of discussions regarding goals of care.  NUTRITION DIAGNOSIS:   Malnutrition (Severe) related to chronic illness (dementia, advanced age) as evidenced by severe depletion of body fat, severe depletion of muscle mass.  GOAL:   Patient will meet greater than or equal to 90% of their needs  MONITOR:   PO intake, Supplement acceptance, Labs, Weight trends, I & O's  REASON FOR ASSESSMENT:   Low [place]    ASSESSMENT:   83 year old female with PMHx of dementia, CKD, HTN, adult FTT, anxiety, Vitamin D deficiency, muscle weakness, GERD who presented with AMS found to have severe sepsis secondary to UTI, leukopenia, hypothermia, hypernatremia with severe dehydration.   -PMT consulted to discuss [place].  Met with patient and her daughter at bedside. Patient alert and able to mumble some, but not able to provide history. Daughter reports patient is provided three meals per day. She usually finishes somewhere between 50-75% of meals. She reports patient needs assistance with eating meals and also drinking oral nutrition supplements.   She reports patient used to weigh 200 lbs many years ago. She had slow weight loss over time and has been weight stable for a while now. Per chart she was 123.3 lbs on 04/26/2016. Insignificant weight loss over greater than one year.  Medications reviewed and include: Novolog 0-9 units Q4hrs, D5-1/2NS at 100 ml/hr (120 grams dextrose, 408 kcal daily).  Labs reviewed: CBG 93-111, Chloride 114, BUN 23.  Nutrition-Focused physical exam completed. Findings are severe fat depletion, severe muscle depletion, and no edema.  Discussed with  PMT.  Diet Order:  DIET DYS 3 Room service appropriate? Yes; Fluid consistency: Thin  Skin:  Wound (see comment) (skin tear right lower leg)  Last BM:  Unknown  Height:   Ht Readings from Last 1 Encounters:  08/03/17 _0  (1.6 m)    Weight:   Wt Readings from Last 1 Encounters:  08/04/17 119 lb 6.4 oz (54.2 kg)    Ideal Body Weight:  52.3 kg  BMI:  Body mass index is 21.15 kg/m.  Estimated Nutritional Needs:   Kcal:  1355-1630 (25-30 kcal/kg)  Protein:  65-75 grams (1.2-1.4 grams/kg)  Fluid:  1.3 L/day (25 ml/kg)  EDUCATION NEEDS:   Education needs addressed  Willey Blade, MS, RD, LDN Pager: [phone] After Hours Pager: [phone]

## 2017-08-04 NOTE — Progress Notes (Signed)
PHARMACY - PHYSICIAN COMMUNICATION CRITICAL VALUE ALERT - BLOOD CULTURE IDENTIFICATION (BCID)  Results for orders placed or performed during the hospital encounter of 08/03/17  Blood Culture ID Panel (Reflexed) (Collected: 08/03/2017 11:27 AM)  Result Value Ref Range   Enterococcus species NOT DETECTED NOT DETECTED   Listeria monocytogenes NOT DETECTED NOT DETECTED   Staphylococcus species DETECTED (A) NOT DETECTED   Staphylococcus aureus NOT DETECTED NOT DETECTED   Methicillin resistance NOT DETECTED NOT DETECTED   Streptococcus species NOT DETECTED NOT DETECTED   Streptococcus agalactiae NOT DETECTED NOT DETECTED   Streptococcus pneumoniae NOT DETECTED NOT DETECTED   Streptococcus pyogenes NOT DETECTED NOT DETECTED   Acinetobacter baumannii NOT DETECTED NOT DETECTED   Enterobacteriaceae species NOT DETECTED NOT DETECTED   Enterobacter cloacae complex NOT DETECTED NOT DETECTED   Escherichia coli NOT DETECTED NOT DETECTED   Klebsiella oxytoca NOT DETECTED NOT DETECTED   Klebsiella pneumoniae NOT DETECTED NOT DETECTED   Proteus species NOT DETECTED NOT DETECTED   Serratia marcescens NOT DETECTED NOT DETECTED   Haemophilus influenzae NOT DETECTED NOT DETECTED   Neisseria meningitidis NOT DETECTED NOT DETECTED   Pseudomonas aeruginosa NOT DETECTED NOT DETECTED   Candida albicans NOT DETECTED NOT DETECTED   Candida glabrata NOT DETECTED NOT DETECTED   Candida krusei NOT DETECTED NOT DETECTED   Candida parapsilosis NOT DETECTED NOT DETECTED   Candida tropicalis NOT DETECTED NOT DETECTED    Name of physician (or Provider) Contacted: Dr. Sherryll BurgerShah  Changes to prescribed antibiotics required: none  Valentina GuChristy, Klint Lezcano D 08/04/2017  1:33 PM

## 2017-08-04 NOTE — Consult Note (Signed)
Consultation Note Date: 08/04/2017   Patient Name: Tonya King  DOB: 1926-01-18  MRN: [id number]  Age / Sex: 83 y.o., female  PCP: Earlyne Iba, MD Referring Physician: Max Sane, MD  Reason for Consultation: Establishing goals of care  HPI/Patient Profile: 83 y.o. female  with past medical history of advanced dementia, bedbound status, severe protein calorie malnutrition, recurrent UTIs admitted on 08/03/2017 with bradycardia and AMS r/t UTI and severe dehydration with hypernatremia.   Clinical Assessment and Goals of Care: I met today at Ms. Emmick bedside along with her daughter, Melvia Heaps. Ms. Dema Severin tells me that she visits her mother 1-2 times daily and assists with feeding and making sure she is staying hydrated. She says that she has no problem getting her mother to accept food or fluids (acknowledging that she has had steady and gradual weight loss and decline with dementia over the years). Her main concern is that she went away on vacation last week and wasn't around to make sure her mother was eating and drinking and now she is dehydrated with UTI. She plans to meet with staff at facility to discuss further her concerns.   We further discussed Ms. Radovich and what to expect. Ms. Dema Severin acknowledges that intake declines with dementia and that her mother may not recover from this illness to be as well as she was prior to admission. At this time Ms. White is hopeful for continued improvement as her mother has responded well to IVF/antibiotics so far. She has full understanding that her mother could decline towards EOL at any time and is very frail. She has helped care for her father and her husband at their EOL. She asks for help in recognizing progressing dementia vs reversible issues.  She would benefit from continued discussion with palliative care at facility. Hard Choices booklet provided.    Of note, Ms. Rochon does have a son (daughter says she is estranged from brother and he is not involved in her mother's care) but I do not have his contact information.   Primary Decision Maker NEXT OF KIN daughter Melvia Heaps    SUMMARY OF RECOMMENDATIONS   - Hopeful for continued improvement - Palliative to follow at SNF  Code Status/Advance Care Planning:  DNR - already in place and I did not discuss today   Symptom Management:     Palliative Prophylaxis:   Aspiration, Bowel Regimen, Delirium Protocol, Frequent Pain Assessment, Oral Care and Turn Reposition  Additional Recommendations (Limitations, Scope, Preferences):  Avoid Hospitalization  Psycho-social/Spiritual:   Desire for further Chaplaincy support:no  Additional Recommendations: Caregiving  Support/Resources and Education on Hospice  Prognosis:   Poor prognosis with progressing dementia. May be nearing hospice eligibility - will see her ability to maintain nutrition/fluids.   Discharge Planning: To Be Determined      Primary Diagnoses: Present on Admission: . Hypernatremia . DNR (do not resuscitate) . Dehydration   I have reviewed the medical record, interviewed the patient and family, and examined the patient. The following aspects  are pertinent.  Past Medical History:  Diagnosis Date  . Abnormal posture   . Adult failure to thrive   . Anxiety   . Chronic kidney disease   . Dehydration   . Dementia   . Difficulty walking   . GERD (gastroesophageal reflux disease)   . Hypertension   . Muscle weakness (generalized)   . Sequelae of cerebrovascular disease   . Vitamin D deficiency    Social History   Social History  . Marital status: Widowed    Spouse name: N/A  . Number of children: N/A  . Years of education: N/A   Social History Main Topics  . Smoking status: Never Smoker  . Smokeless tobacco: Never Used  . Alcohol use No  . Drug use: No  . Sexual activity: Not Asked    Other Topics Concern  . None   Social History Narrative  . None   History reviewed. No pertinent family history. Scheduled Meds: . enoxaparin (LOVENOX) injection  40 mg Subcutaneous Q24H  . insulin aspart  0-9 Units Subcutaneous Q4H  . sodium chloride flush  3 mL Intravenous Q12H   Continuous Infusions: . dextrose 5 % and 0.45% NaCl 100 mL/hr at 08/04/17 0837  . meropenem (MERREM) IV Stopped (08/04/17 0315)   PRN Meds:.acetaminophen **OR** acetaminophen, albuterol, bisacodyl, ondansetron **OR** ondansetron (ZOFRAN) IV, polyethylene glycol, sodium phosphate No Known Allergies Review of Systems  Unable to perform ROS: Dementia    Physical Exam  Constitutional: She appears well-developed. She appears lethargic. She appears cachectic.  Cardiovascular: Bradycardia present.   Pulmonary/Chest: Effort normal. No accessory muscle usage. No tachypnea. No respiratory distress.  Abdominal: Normal appearance.  Neurological: She appears lethargic. She is disoriented.  Nursing note and vitals reviewed.   Vital Signs: BP (!) 153/77 (BP Location: Right Arm)   Pulse (!) 42   Temp 98.1 F (36.7 C) (Axillary)   Resp 16   Ht 5' 3"  (1.6 m)   Wt 54.2 kg (119 lb 6.4 oz)   SpO2 100%   BMI 21.15 kg/m  Pain Assessment: PAINAD       SpO2: SpO2: 100 % O2 Device:SpO2: 100 % O2 Flow Rate: .   IO: Intake/output summary:  Intake/Output Summary (Last 24 hours) at 08/04/17 1058 Last data filed at 08/04/17 0449  Gross per 24 hour  Intake             2255 ml  Output              550 ml  Net             1705 ml    LBM: Last BM Date:  (last BM unknown) Baseline Weight: Weight: 54.4 kg (120 lb) Most recent weight: Weight: 54.2 kg (119 lb 6.4 oz)     Palliative Assessment/Data: 20%     Time Total: 80 min  Greater than 50%  of this time was spent counseling and coordinating care related to the above assessment and plan.  Signed by: Vinie Sill, NP Palliative Medicine Team Pager  # [phone] (M-F 8a-5p) Team Phone # [phone] (Nights/Weekends)

## 2017-08-04 NOTE — Care Management Note (Signed)
Case Management Note  Patient Details  Name: Tonya LunaLevonia King MRN: [id number] Date of Birth: 1926-08-28  Subjective/Objective:      Admitted to [facility] with the diagnosis of hypernatremia. A [facility]. Son is Billey GoslingCharlie [phone]).  Sodium level = 145.   Temperature = 96.7 Bear hugger applied. IV Meropenum and Zosyn ordered. Palliative Care consult. Foley in place.  Lethargic.             Action/Plan: Will continue to follow for discharge plans, if needed.   Expected Discharge Date:                  Expected Discharge Plan:     In-House Referral:     Discharge planning Services     Post Acute Care Choice:    Choice offered to:     DME Arranged:    DME Agency:     HH Arranged:    HH Agency:     Status of Service:     If discussed at [employer] Meetings, dates discussed:    Additional Comments:  Gwenette GreetBrenda S Yitzchak Kothari, RNMSN CCM Care Management [phone] 08/04/2017, 8:27 AM

## 2017-08-05 LAB — BASIC METABOLIC PANEL
Anion gap: 4 — ABNORMAL LOW (ref 5–15)
BUN: 16 mg/dL (ref 6–20)
CHLORIDE: 112 mmol/L — AB (ref 101–111)
CO2: 27 mmol/L (ref 22–32)
CREATININE: 0.91 mg/dL (ref 0.44–1.00)
Calcium: 8.9 mg/dL (ref 8.9–10.3)
GFR calc Af Amer: >60 mL/min (ref >60)
GFR calc non Af Amer: 54 mL/min — ABNORMAL LOW (ref >60)
Glucose, Bld: 89 mg/dL (ref 65–99)
Potassium: 3.4 mmol/L — ABNORMAL LOW (ref 3.5–5.1)
Sodium: 143 mmol/L (ref 135–145)

## 2017-08-05 LAB — URINE CULTURE: Culture: >=100000 — AB

## 2017-08-05 LAB — GLUCOSE, CAPILLARY
GLUCOSE-CAPILLARY: 67 mg/dL (ref 65–99)
GLUCOSE-CAPILLARY: 77 mg/dL (ref 65–99)
GLUCOSE-CAPILLARY: 79 mg/dL (ref 65–99)
Glucose-Capillary: 90 mg/dL (ref 65–99)

## 2017-08-05 LAB — CBC
HEMATOCRIT: 34.8 % — AB (ref 35.0–47.0)
HEMOGLOBIN: 11.7 g/dL — AB (ref 12.0–16.0)
MCH: 30.7 pg (ref 26.0–34.0)
MCHC: 33.6 g/dL (ref 32.0–36.0)
MCV: 91.4 fL (ref 80.0–100.0)
Platelets: 150 10*3/uL (ref 150–440)
RBC: 3.81 MIL/uL (ref 3.80–5.20)
RDW: 16.9 % — AB (ref 11.5–14.5)
WBC: 3.3 10*3/uL — ABNORMAL LOW (ref 3.6–11.0)

## 2017-08-05 MED ORDER — SENNOSIDES-DOCUSATE SODIUM 8.6-50 MG PO TABS
2.0000 | ORAL_TABLET | Freq: Two times a day (BID) | ORAL | Status: DC | PRN
  Administered 2017-08-05: 2 via ORAL
  Filled 2017-08-05: qty 2

## 2017-08-05 MED ORDER — SULFAMETHOXAZOLE-TRIMETHOPRIM 200-40 MG/5ML PO SUSP
20.0000 mL | Freq: Two times a day (BID) | ORAL | Status: DC
  Administered 2017-08-05 – 2017-08-06 (×3): 20 mL via ORAL
  Filled 2017-08-05 (×4): qty 20

## 2017-08-05 MED ORDER — ARTIFICIAL TEARS OPHTHALMIC OINT
TOPICAL_OINTMENT | Freq: Three times a day (TID) | OPHTHALMIC | Status: DC
  Administered 2017-08-05 – 2017-08-06 (×3): via OPHTHALMIC
  Filled 2017-08-05: qty 3.5

## 2017-08-05 NOTE — Consult Note (Signed)
[facility] Infectious Disease     Reason for Consult:E coli UTI, ESBL, GPC bacteremia   Referring Physician: Carlynn Spry Date of Admission:  08/03/2017   Active Problems:   Hypernatremia   DNR (do not resuscitate)   Palliative care encounter   Dehydration   Dementia without behavioral disturbance   Goals of care, counseling/discussion   HPI: Tonya Borden is a 83 y.o. female admitted with AMS.   She has dementia, lives at a facility and is largely bedbound. On admit found to have hypothermia, wbc 2.5  and UA evidence of UTI. BCX + 1/2 staph species. UCX + ESBL E coli. Daughter reports a little more alert today but still not at baseline.    Past Medical History:  Diagnosis Date  . Abnormal posture   . Adult failure to thrive   . Anxiety   . Chronic kidney disease   . Dehydration   . Dementia   . Difficulty walking   . GERD (gastroesophageal reflux disease)   . Hypertension   . Muscle weakness (generalized)   . Sequelae of cerebrovascular disease   . Vitamin D deficiency    History reviewed. No pertinent surgical history. Social History  Substance Use Topics  . Smoking status: Never Smoker  . Smokeless tobacco: Never Used  . Alcohol use No   History reviewed. No pertinent family history.  Allergies: No Known Allergies  Current antibiotics: Antibiotics Given (last 72 hours)    Date/Time Action Medication Dose Rate   08/03/17 1135 New Bag/Given   piperacillin-tazobactam (ZOSYN) IVPB 3.375 g 3.375 g 100 mL/hr   08/03/17 1441 New Bag/Given   meropenem (MERREM) 1 g in sodium chloride 0.9 % 100 mL IVPB 1 g 200 mL/hr   08/04/17 0245 New Bag/Given   meropenem (MERREM) 1 g in sodium chloride 0.9 % 100 mL IVPB 1 g 200 mL/hr   08/04/17 1602 New Bag/Given  [medication not available]   meropenem (MERREM) 1 g in sodium chloride 0.9 % 100 mL IVPB 1 g 200 mL/hr   08/05/17 0335 New Bag/Given   meropenem (MERREM) 1 g in sodium chloride 0.9 % 100 mL IVPB 1 g 200 mL/hr   08/05/17  1408 Given   sulfamethoxazole-trimethoprim (BACTRIM,SEPTRA) 200-40 MG/5ML suspension 20 mL 20 mL       MEDICATIONS: . artificial tears   Both Eyes Q8H  . enoxaparin (LOVENOX) injection  40 mg Subcutaneous Q24H  . feeding supplement (ENSURE ENLIVE)  237 mL Oral BID BM  . sodium chloride flush  3 mL Intravenous Q12H  . sulfamethoxazole-trimethoprim  20 mL Oral Q12H    Review of Systems - unable to obtain   OBJECTIVE: Temp:  [97.6 F (36.4 C)-97.8 F (36.6 C)] 97.8 F (36.6 C) (09/06 1227) Pulse Rate:  [47-69] 69 (09/06 1227) Resp:  [16-18] 16 (09/06 1227) BP: (139-174)/(82-95) 139/82 (09/06 1227) SpO2:  [69 %-100 %] 100 % (09/06 1227) Weight:  [54.7 kg (120 lb 8 oz)] 54.7 kg (120 lb 8 oz) (09/06 0430) Physical Exam  Constitutional:  Frail, lying on side in med, minimally interactive HENT: [place]/AT, PERRLA, no scleral icterus Mouth/Throat: Oropharynx is clear and dry. No oropharyngeal exudate.  Cardiovascular: Normal rate, regular rhythm and normal heart sounds.  Pulmonary/Chest: Effort normal and breath sounds normal. No respiratory distress.  has no wheezes.  Neck = supple, no nuchal rigidity Abdominal: Soft. Bowel sounds are normal.  Lymphadenopathy: no cervical adenopathy. No axillary adenopathy Neurological: lethargic Skin: Skin is warm and dry. No rash  noted. No erythema.  Psychiatric: unable to assess  LABS: Results for orders placed or performed during the hospital encounter of 08/03/17 (from the past 48 hour(s))  MRSA PCR Screening     Status: None   Collection Time: 08/03/17  3:55 PM  Result Value Ref Range   MRSA by PCR NEGATIVE NEGATIVE    Comment:        The GeneXpert MRSA Assay (FDA approved for NASAL specimens only), is one component of a comprehensive MRSA colonization surveillance program. It is not intended to diagnose MRSA infection nor to guide or monitor treatment for MRSA infections.   Glucose, capillary     Status: None   Collection Time:  08/03/17  4:41 PM  Result Value Ref Range   Glucose-Capillary 94 65 - 99 mg/dL  Glucose, capillary     Status: None   Collection Time: 08/03/17  8:24 PM  Result Value Ref Range   Glucose-Capillary 79 65 - 99 mg/dL  Glucose, capillary     Status: Abnormal   Collection Time: 08/04/17 12:04 AM  Result Value Ref Range   Glucose-Capillary 105 (H) 65 - 99 mg/dL  Glucose, capillary     Status: Abnormal   Collection Time: 08/04/17  4:23 AM  Result Value Ref Range   Glucose-Capillary 111 (H) 65 - 99 mg/dL  Basic metabolic panel     Status: Abnormal   Collection Time: 08/04/17  4:52 AM  Result Value Ref Range   Sodium 145 135 - 145 mmol/L   Potassium 3.7 3.5 - 5.1 mmol/L   Chloride 114 (H) 101 - 111 mmol/L   CO2 27 22 - 32 mmol/L   Glucose, Bld 110 (H) 65 - 99 mg/dL   BUN 23 (H) 6 - 20 mg/dL   Creatinine, Ser 0.79 0.44 - 1.00 mg/dL   Calcium 9.0 8.9 - 10.3 mg/dL   GFR calc non Af Amer >60 >60 mL/min   GFR calc Af Amer >60 >60 mL/min    Comment: (NOTE) The eGFR has been calculated using the CKD EPI equation. This calculation has not been validated in all clinical situations. eGFR's persistently <60 mL/min signify possible Chronic Kidney Disease.    Anion gap 4 (L) 5 - 15  CBC     Status: Abnormal   Collection Time: 08/04/17  4:52 AM  Result Value Ref Range   WBC 2.5 (L) 3.6 - 11.0 K/uL   RBC 3.78 (L) 3.80 - 5.20 MIL/uL   Hemoglobin 11.8 (L) 12.0 - 16.0 g/dL   HCT 35.4 35.0 - 47.0 %   MCV 93.7 80.0 - 100.0 fL   MCH 31.4 26.0 - 34.0 pg   MCHC 33.5 32.0 - 36.0 g/dL   RDW 17.4 (H) 11.5 - 14.5 %   Platelets 144 (L) 150 - 440 K/uL  Glucose, capillary     Status: None   Collection Time: 08/04/17  7:59 AM  Result Value Ref Range   Glucose-Capillary 93 65 - 99 mg/dL  Glucose, capillary     Status: Abnormal   Collection Time: 08/04/17 12:00 PM  Result Value Ref Range   Glucose-Capillary 105 (H) 65 - 99 mg/dL  Glucose, capillary     Status: None   Collection Time: 08/04/17  4:28 PM   Result Value Ref Range   Glucose-Capillary 95 65 - 99 mg/dL   Comment 1 Notify RN    Comment 2 Document in Chart   Glucose, capillary     Status: None   Collection Time:  08/04/17  7:37 PM  Result Value Ref Range   Glucose-Capillary 98 65 - 99 mg/dL  Glucose, capillary     Status: None   Collection Time: 08/05/17 12:08 AM  Result Value Ref Range   Glucose-Capillary 77 65 - 99 mg/dL  Glucose, capillary     Status: None   Collection Time: 08/05/17  4:28 AM  Result Value Ref Range   Glucose-Capillary 67 65 - 99 mg/dL  Glucose, capillary     Status: None   Collection Time: 08/05/17  4:35 AM  Result Value Ref Range   Glucose-Capillary 79 65 - 99 mg/dL  Glucose, capillary     Status: None   Collection Time: 08/05/17  8:05 AM  Result Value Ref Range   Glucose-Capillary 90 65 - 99 mg/dL  CBC     Status: Abnormal   Collection Time: 08/05/17  9:17 AM  Result Value Ref Range   WBC 3.3 (L) 3.6 - 11.0 K/uL   RBC 3.81 3.80 - 5.20 MIL/uL   Hemoglobin 11.7 (L) 12.0 - 16.0 g/dL   HCT 34.8 (L) 35.0 - 47.0 %   MCV 91.4 80.0 - 100.0 fL   MCH 30.7 26.0 - 34.0 pg   MCHC 33.6 32.0 - 36.0 g/dL   RDW 16.9 (H) 11.5 - 14.5 %   Platelets 150 150 - 440 K/uL  Basic metabolic panel     Status: Abnormal   Collection Time: 08/05/17  9:17 AM  Result Value Ref Range   Sodium 143 135 - 145 mmol/L   Potassium 3.4 (L) 3.5 - 5.1 mmol/L   Chloride 112 (H) 101 - 111 mmol/L   CO2 27 22 - 32 mmol/L   Glucose, Bld 89 65 - 99 mg/dL   BUN 16 6 - 20 mg/dL   Creatinine, Ser 0.91 0.44 - 1.00 mg/dL   Calcium 8.9 8.9 - 10.3 mg/dL   GFR calc non Af Amer 54 (L) >60 mL/min   GFR calc Af Amer >60 >60 mL/min    Comment: (NOTE) The eGFR has been calculated using the CKD EPI equation. This calculation has not been validated in all clinical situations. eGFR's persistently <60 mL/min signify possible Chronic Kidney Disease.    Anion gap 4 (L) 5 - 15   No components found for: ESR, C REACTIVE PROTEIN MICRO: Recent  Results (from the past 720 hour(s))  Urine Culture     Status: Abnormal   Collection Time: 08/03/17 11:04 AM  Result Value Ref Range Status   Specimen Description URINE, RANDOM  Final   Special Requests NONE  Final   Culture (A)  Final    >=100,000 COLONIES/mL ESCHERICHIA COLI Confirmed Extended Spectrum Beta-Lactamase Producer (ESBL) Performed at [facility], [place] [address] [place], [place] [address]    Report Status 08/05/2017 FINAL  Final   Organism ID, Bacteria ESCHERICHIA COLI (A)  Final      Susceptibility   Escherichia coli - MIC*    AMPICILLIN >=32 RESISTANT Resistant     CEFAZOLIN >=64 RESISTANT Resistant     CEFTRIAXONE >=64 RESISTANT Resistant     CIPROFLOXACIN >=4 RESISTANT Resistant     GENTAMICIN <=1 SENSITIVE Sensitive     IMIPENEM <=0.25 SENSITIVE Sensitive     NITROFURANTOIN <=16 SENSITIVE Sensitive     TRIMETH/SULFA <=20 SENSITIVE Sensitive     AMPICILLIN/SULBACTAM 16 INTERMEDIATE Intermediate     PIP/TAZO 8 SENSITIVE Sensitive     Extended ESBL POSITIVE Resistant     * >=100,000  COLONIES/mL ESCHERICHIA COLI  Blood culture (routine x 2)     Status: None (Preliminary result)   Collection Time: 08/03/17 11:27 AM  Result Value Ref Range Status   Specimen Description BLOOD BLOOD LEFT HAND  Final   Special Requests   Final    BOTTLES DRAWN AEROBIC AND ANAEROBIC Blood Culture adequate volume   Culture  Setup Time   Final    Organism ID to follow GRAM POSITIVE COCCI ANAEROBIC BOTTLE ONLY CRITICAL RESULT CALLED TO, READ BACK BY AND VERIFIED WITH: KAREN HAYES 08/04/17 1202 KLW GRAM STAIN REVIEWED-AGREE WITH RESULT Performed at [facility], [place] [address] [place], [place] [address]    Culture GRAM POSITIVE COCCI  Final   Report Status PENDING  Incomplete  Blood culture (routine x 2)     Status: None (Preliminary result)   Collection Time: 08/03/17 11:27 AM  Result Value Ref Range Status   Specimen Description BLOOD LEFT ANTECUBITAL  Final   Special  Requests   Final    BOTTLES DRAWN AEROBIC AND ANAEROBIC Blood Culture adequate volume   Culture NO GROWTH 2 DAYS  Final   Report Status PENDING  Incomplete  Blood Culture ID Panel (Reflexed)     Status: Abnormal   Collection Time: 08/03/17 11:27 AM  Result Value Ref Range Status   Enterococcus species NOT DETECTED NOT DETECTED Final   Listeria monocytogenes NOT DETECTED NOT DETECTED Final   Staphylococcus species DETECTED (A) NOT DETECTED Final    Comment: Methicillin (oxacillin) susceptible coagulase negative staphylococcus. Possible blood culture contaminant (unless isolated from more than one blood culture draw or clinical case suggests pathogenicity). No antibiotic treatment is indicated for blood  culture contaminants. CRITICAL RESULT CALLED TO, READ BACK BY AND VERIFIED WITH: KAREN HAYES 08/04/17 1202 KLW    Staphylococcus aureus NOT DETECTED NOT DETECTED Final   Methicillin resistance NOT DETECTED NOT DETECTED Final   Streptococcus species NOT DETECTED NOT DETECTED Final   Streptococcus agalactiae NOT DETECTED NOT DETECTED Final   Streptococcus pneumoniae NOT DETECTED NOT DETECTED Final   Streptococcus pyogenes NOT DETECTED NOT DETECTED Final   Acinetobacter baumannii NOT DETECTED NOT DETECTED Final   Enterobacteriaceae species NOT DETECTED NOT DETECTED Final   Enterobacter cloacae complex NOT DETECTED NOT DETECTED Final   Escherichia coli NOT DETECTED NOT DETECTED Final   Klebsiella oxytoca NOT DETECTED NOT DETECTED Final   Klebsiella pneumoniae NOT DETECTED NOT DETECTED Final   Proteus species NOT DETECTED NOT DETECTED Final   Serratia marcescens NOT DETECTED NOT DETECTED Final   Haemophilus influenzae NOT DETECTED NOT DETECTED Final   Neisseria meningitidis NOT DETECTED NOT DETECTED Final   Pseudomonas aeruginosa NOT DETECTED NOT DETECTED Final   Candida albicans NOT DETECTED NOT DETECTED Final   Candida glabrata NOT DETECTED NOT DETECTED Final   Candida krusei NOT DETECTED  NOT DETECTED Final   Candida parapsilosis NOT DETECTED NOT DETECTED Final   Candida tropicalis NOT DETECTED NOT DETECTED Final  MRSA PCR Screening     Status: None   Collection Time: 08/03/17  3:55 PM  Result Value Ref Range Status   MRSA by PCR NEGATIVE NEGATIVE Final    Comment:        The GeneXpert MRSA Assay (FDA approved for NASAL specimens only), is one component of a comprehensive MRSA colonization surveillance program. It is not intended to diagnose MRSA infection nor to guide or monitor treatment for MRSA infections.     IMAGING: Dg Abd 1 View  Result Date:  08/03/2017 CLINICAL DATA:  Abdominal pain EXAM: ABDOMEN - 1 VIEW COMPARISON:  CT abdomen/ pelvis dated 08/03/2017 FINDINGS: Moderate colonic stool burden, particularly in the rectum. No evidence of small bowel obstruction. Degenerative changes of the visualized thoracolumbar spine with postsurgical changes at L3-4. Surgical clips overlying the bilateral pelvis. IMPRESSION: Moderate rectal stool burden. Electronically Signed   By: Julian Hy M.D.   On: 08/03/2017 13:16   Ct Head Wo Contrast  Result Date: 08/03/2017 CLINICAL DATA:  The patient had an episode of unresponsiveness this morning. No known injury. EXAM: CT HEAD WITHOUT CONTRAST TECHNIQUE: Contiguous axial images were obtained from the base of the skull through the vertex without intravenous contrast. COMPARISON:  Head CT scan 04/26/2016. FINDINGS: Brain: Cortical atrophy and chronic microvascular ischemic change are identified. No evidence of acute abnormality including hemorrhage, infarct, mass lesion, mass effect, midline shift or abnormal extra-axial fluid collection. No hydrocephalus or pneumocephalus. Vascular: Atherosclerosis noted. Skull: Intact. Sinuses/Orbits: Negative. Other: None. IMPRESSION: No acute abnormality. Atrophy and chronic microvascular ischemic change. Electronically Signed   By: Inge Rise M.D.   On: 08/03/2017 12:40   Dg Chest  Portable 1 View  Result Date: 08/03/2017 CLINICAL DATA:  Unresponsive. EXAM: PORTABLE CHEST 1 VIEW COMPARISON:  03/06/2014 FINDINGS: 1103 hours. Lucency under the right hemidiaphragm raises the question of intraperitoneal free air. Lungs are clear. The cardio pericardial silhouette is enlarged. Opacity overlying the medial right apex is stable and likely related to ectatic vascular anatomy. Bones are diffusely demineralized. Telemetry leads overlie the chest. IMPRESSION: 1. Lucency under the right hemidiaphragm raises the question of intraperitoneal free air. Acute abdomen series recommended to further evaluate. 2. Cardiomegaly without acute cardiopulmonary findings. Electronically Signed   By: Misty Stanley M.D.   On: 08/03/2017 11:46   Ct Renal Stone Study  Result Date: 08/03/2017 CLINICAL DATA:  Unresponsive with bradycardia. EXAM: CT ABDOMEN AND PELVIS WITHOUT CONTRAST TECHNIQUE: Multidetector CT imaging of the abdomen and pelvis was performed following the standard protocol without IV contrast. COMPARISON:  No comparison studies available. FINDINGS: Lower chest: Heart is enlarged. Hepatobiliary: Multiple low-density liver lesions are likely cysts. There is no evidence for gallstones, gallbladder wall thickening, or pericholecystic fluid. No intrahepatic or extrahepatic biliary dilation. Pancreas: No focal mass lesion. No dilatation of the main duct. No intraparenchymal cyst. No peripancreatic edema. Spleen: No splenomegaly. No focal mass lesion. Adrenals/Urinary Tract: No adrenal nodule or mass. Bilateral renal cysts are evident measuring up to 7.6 cm in the right kidney and 4.1 cm in the left kidney. Multiple smaller cysts are seen in the kidneys bilaterally. Several small lesions in each kidney cannot be definitively characterize. No hydronephrosis. No evidence for hydroureter. Foley catheter noted in the urinary bladder. Stomach/Bowel: Stomach is nondistended. No gastric wall thickening. No evidence of  outlet obstruction. Duodenum is normally positioned as is the ligament of Treitz. No evidence for small bowel obstruction. Diverticuli are seen scattered along the entire length of the colon without CT findings of diverticulitis. Formed stool is seen in the sigmoid colon in there is an extremely large amount of stool in the distal sigmoid colon and rectum. Stool-filled rectum measures up to 10.1 x 9.2 cm in maximum orthogonal diameters on axial imaging. Vascular/Lymphatic: There is abdominal aortic atherosclerosis without aneurysm. There is no gastrohepatic or hepatoduodenal ligament lymphadenopathy. No intraperitoneal or retroperitoneal lymphadenopathy. No pelvic sidewall lymphadenopathy. Reproductive: Uterus surgically absent.  There is no adnexal mass. Other: No intraperitoneal free fluid. Musculoskeletal: Soft tissue attenuation in the subcutaneous  fat of the left posterior lower pelvis, posterior to the ischial tuberosity, raises the question of sacral decubitus ulcer. Bones are diffusely demineralized. Status post lower lumbar fusion. Age indeterminate compression fractures are evident at T12 and L3. IMPRESSION: 1. Markedly distended stool-filled rectum measuring 10.1 x 9.2 cm and maximum orthogonal diameters. Imaging features raise concern for fecal impaction. 2. Hepatic and renal cysts. Some small renal lesions on today's study cannot be definitively characterized. 3. Diffuse colonic diverticulosis without diverticulitis. 4.  Aortic Atherosclerois (ICD10-170.0) Electronically Signed   By: Misty Stanley M.D.   On: 08/03/2017 12:44    Assessment:   Destyn King is a 83 y.o. female with dementia, bedbound admitted with AMS, hypothermia and found to have UTI with ESBL E coli. [place] + 1/2 staph species likely contaminant. No real wounds or skin breakdown. Slight improvement but in very poor health and wound be a hospice candidate.  Recommendations Agree with bactrim x 7 days total for UTI Agree with  hospice evaulation  Thank you very much for allowing me to participate in the care of this patient. Please call with questions.   Cheral Marker. Ola Spurr, MD

## 2017-08-05 NOTE — Progress Notes (Signed)
Daily Progress Note   Patient Name: Tonya King       Date: 08/05/2017 DOB: 01/09/1926  Age: 83 y.o. MRN#: [id number] Attending Physician: Max Sane, MD Primary Care Physician: Duffy Bruce, Manya Silvas, MD Admit Date: 08/03/2017  Reason for Consultation/Follow-up: Establishing goals of care  Subjective: Tonya King is lethargic today but does arouse.   Length of Stay: 2  Current Medications: Scheduled Meds:  . artificial tears   Both Eyes Q8H  . enoxaparin (LOVENOX) injection  40 mg Subcutaneous Q24H  . feeding supplement (ENSURE ENLIVE)  237 mL Oral BID BM  . sodium chloride flush  3 mL Intravenous Q12H  . sulfamethoxazole-trimethoprim  20 mL Oral Q12H    Continuous Infusions: . dextrose 5 % and 0.45% NaCl 100 mL/hr at 08/05/17 0527    PRN Meds: acetaminophen **OR** acetaminophen, albuterol, bisacodyl, ondansetron **OR** ondansetron (ZOFRAN) IV, polyethylene glycol, sodium phosphate  Physical Exam     Constitutional: She appears well-developed. She appears lethargic. She appears cachectic.  Cardiovascular: Bradycardia present.   Pulmonary/Chest: Effort normal. No accessory muscle usage. No tachypnea. No respiratory distress.  Abdominal: Normal appearance.  Neurological: She appears lethargic. She is disoriented.  Nursing note and vitals reviewed.   Vital Signs: BP 139/82 (BP Location: Right Arm)   Pulse 69   Temp 97.8 F (36.6 C) (Axillary)   Resp 16   Ht 5' 3"  (1.6 m)   Wt 54.7 kg (120 lb 8 oz)   SpO2 100%   BMI 21.35 kg/m  SpO2: SpO2: 100 % O2 Device: O2 Device: Not Delivered O2 Flow Rate: O2 Flow Rate (L/min): 0 L/min  Intake/output summary:  Intake/Output Summary (Last 24 hours) at 08/05/17 1344 Last data filed at 08/05/17 5361  Gross per 24 hour    Intake          4207.67 ml  Output              250 ml  Net          3957.67 ml   LBM: Last BM Date:  (last BM unknown) Baseline Weight: Weight: 54.4 kg (120 lb) Most recent weight: Weight: 54.7 kg (120 lb 8 oz)       Palliative Assessment/Data: 20%      Patient Active Problem List   Diagnosis Date Noted  .  Dementia without behavioral disturbance   . Goals of care, counseling/discussion   . Hypernatremia 08/03/2017  . DNR (do not resuscitate) 08/03/2017  . Palliative care encounter 08/03/2017  . Dehydration 08/03/2017    Palliative Care Assessment & Plan   HPI: 83 y.o. female  with past medical history of advanced dementia, bedbound status, severe protein calorie malnutrition, recurrent UTIs admitted on 08/03/2017 with bradycardia and AMS r/t UTI and severe dehydration with hypernatremia.    Assessment: I met again today with daughter, Melvia Heaps, regarding Tonya King status. We discussed her intake, possible bacteremia, and overall expectations. Tonya King is worried that her mother is worse today (mental status). We discussed failure to thrive and how fragile her mother is and that she may not be able to overcome infection - UTI or especially bacteremia - and this may be a drastic change to her baseline. Explained that she may have good and bad days and that her status may fluctuate.   I also brought up consideration of hospice care at SNF especially with likely continued decline and poor prognosis. Tonya King believes that this is a great idea and would appreciate the support and assistance for her mother. Hopeful to optimize her mother's health and treat the treatable. Tonya King priority is that her mother is comfortable and does not suffer.   Recommendations/Plan:  Dry eyes: Lacrilube TID  Goals of Care and Additional Recommendations:  Limitations on Scope of Treatment: Avoid Hospitalization  Code Status:  DNR  Prognosis:   Poor prognosis with progressing  dementia. May be nearing hospice eligibility - will see her ability to maintain nutrition/fluids.   Discharge Planning:  To Be Determined   Thank you for allowing the Palliative Medicine Team to assist in the care of this patient.   Total Time 20mn Prolonged Time Billed  no       Greater than 50%  of this time was spent counseling and coordinating care related to the above assessment and plan.  AVinie Sill NP Palliative Medicine Team Pager # [phone](M-F 8a-5p) Team Phone # [phone](Nights/Weekends)

## 2017-08-05 NOTE — Progress Notes (Signed)
1        [employer] [facility] [place] at [facility]   PATIENT NAME: Tonya King    MR#:  [id number]  DATE OF BIRTH:  June 25, 1926  SUBJECTIVE:  CHIEF COMPLAINT:   Chief Complaint  Patient presents with  . Bradycardia  . Altered Mental Status  Mumbling and minimal talking, no new issues, urine growing ESBL, BP high REVIEW OF SYSTEMS:  Review of Systems  Unable to perform ROS: Dementia   DRUG ALLERGIES:  No Known Allergies VITALS:  Blood pressure (!) 174/85, pulse (!) 47, temperature 97.7 F (36.5 C), temperature source Axillary, resp. rate 18, height  (1.6 m), weight 54.7 kg (120 lb 8 oz), SpO2 100 %. PHYSICAL EXAMINATION:  Physical Exam  Constitutional: Vital signs are normal. She appears malnourished and dehydrated. She appears unhealthy. She appears toxic. She has a sickly appearance.  HENT:  Head: Normocephalic and atraumatic.  Eyes: Pupils are equal, round, and reactive to light. Conjunctivae and EOM are normal.  Neck: Normal range of motion. Neck supple. No tracheal deviation present. No thyromegaly present.  Cardiovascular: Normal rate, regular rhythm and normal heart sounds.   Pulmonary/Chest: Effort normal and breath sounds normal. No respiratory distress. She has no wheezes. She exhibits no tenderness.  Abdominal: Soft. Bowel sounds are normal. She exhibits no distension. There is no tenderness.  Musculoskeletal: Normal range of motion.  Neurological: She is alert. She is disoriented. No cranial nerve deficit.  Skin: Skin is warm and dry. No rash noted.  Psychiatric: Mood and affect normal.   LABORATORY PANEL:  Female CBC  Recent Labs Lab 08/05/17 0917  WBC 3.3*  HGB 11.7*  HCT 34.8*  PLT 150   ------------------------------------------------------------------------------------------------------------------ Chemistries   Recent Labs Lab 08/03/17 1053  08/05/17 0917  NA 150*  < > 143  K 4.4  < > 3.4*  CL 114*  < > 112*  CO2 31  < >  27  GLUCOSE 82  < > 89  BUN 30*  < > 16  CREATININE 0.75  < > 0.91  CALCIUM 9.7  < > 8.9  MG 2.3  --   --   < > = values in this interval not displayed. RADIOLOGY:  No results found. ASSESSMENT AND PLAN:   * Severe sepsis secondary to ESBL UTI with leukopenia/hypothermia/ acute encephalopathy -Changing meropenem to PO bactrim. Urine cultures growing ESBL E. coli and blood cultures growing coag neg staph.  - pending ID c/s  *  Hypernatremia with severe dehydration due to dementia and decreased oral intake -Resolved with hydration  *Hypokalemia Replete and recheck  * Acute metabolic encephalopathy over dementia due to UTI and dehydration.  * Sinus bradycardia likely due to sepsis. -Monitor Patient is DNR/DNI. No aggressive measures plan. No pacemaker.  * Hypothermia due to sepsis. On a bear hugger.  *Pancytopenia: Due to sepsis  * DVT prophylaxis with Lovenox     All the records are reviewed and case discussed with Care Management/Social Worker. Management plans discussed with the patient, nursing and they are in agreement.  CODE STATUS: DNR  TOTAL TIME TAKING CARE OF THIS PATIENT: 35 minutes.   More than 50% of the time was spent in counseling/coordination of care: YES  POSSIBLE D/C IN 1 DAYS, DEPENDING ON CLINICAL CONDITION.   Delfino Lovett M.D on 08/05/2017 at 11:57 AM  Between 7am to 6pm - Pager - [phone]  After 6pm go to [email or web address] - Therapist, nutritional Hospitalists  Office  [phone]  CC: Primary care physician; Durenda HurtArnaez Zapata, Flossie BuffyGerardo E, MD  Note: This dictation was prepared with Dragon dictation along with smaller phrase technology. Any transcriptional errors that result from this process are unintentional.

## 2017-08-06 LAB — CBC
HEMATOCRIT: 34.8 % — AB (ref 35.0–47.0)
HEMOGLOBIN: 11.9 g/dL — AB (ref 12.0–16.0)
MCH: 31.4 pg (ref 26.0–34.0)
MCHC: 34.2 g/dL (ref 32.0–36.0)
MCV: 91.8 fL (ref 80.0–100.0)
Platelets: 133 10*3/uL — ABNORMAL LOW (ref 150–440)
RBC: 3.79 MIL/uL — AB (ref 3.80–5.20)
RDW: 17.3 % — ABNORMAL HIGH (ref 11.5–14.5)
WBC: 3.9 10*3/uL (ref 3.6–11.0)

## 2017-08-06 LAB — BASIC METABOLIC PANEL
ANION GAP: 4 — AB (ref 5–15)
BUN: 11 mg/dL (ref 6–20)
CALCIUM: 9 mg/dL (ref 8.9–10.3)
CHLORIDE: 110 mmol/L (ref 101–111)
CO2: 29 mmol/L (ref 22–32)
Creatinine, Ser: 0.67 mg/dL (ref 0.44–1.00)
GFR calc Af Amer: >60 mL/min (ref >60)
GFR calc non Af Amer: >60 mL/min (ref >60)
GLUCOSE: 82 mg/dL (ref 65–99)
Potassium: 3.7 mmol/L (ref 3.5–5.1)
Sodium: 143 mmol/L (ref 135–145)

## 2017-08-06 LAB — CULTURE, BLOOD (ROUTINE X 2): Special Requests: ADEQUATE

## 2017-08-06 LAB — GLUCOSE, CAPILLARY
GLUCOSE-CAPILLARY: 86 mg/dL (ref 65–99)
Glucose-Capillary: 85 mg/dL (ref 65–99)

## 2017-08-06 MED ORDER — SULFAMETHOXAZOLE-TRIMETHOPRIM 200-40 MG/5ML PO SUSP: 20.0000 mL | Freq: Two times a day (BID) | ORAL | 0 refills | Status: AC

## 2017-08-06 NOTE — Discharge Instructions (Signed)
Hospice Introduction Hospice is a service that is designed to provide people who are terminally ill and their families with medical, spiritual, and psychological support. Its aim is to improve your quality of life by keeping you as alert and comfortable as possible. Who will be my providers when I begin hospice care? Hospice teams often include:  A nurse.  A doctor. The hospice doctor will be available for your care, but you can bring your regular doctor or nurse practitioner.  Social workers.  Religious leaders (such as a Clinical biochemist).  Trained volunteers.  What roles will providers play in my care? Hospice is performed by a team of health care professionals and volunteers who:  Help keep you comfortable: ? Hospice can be provided in your home or in a homelike setting. ? The hospice staff works with your family and friends to help meet your needs. ? You will enjoy the support of loved ones by receiving much of your basic care from family and friends.  Provide pain relief and manage your symptoms. The staff supply all necessary medicines and equipment.  Provide companionship when you are alone.  Allow you and your family to rest. They may do light housekeeping, prepare meals, and run errands.  Provide counseling. They will make sure your emotional, spiritual, and social needs and those of your family are being met.  Provide spiritual care: ? Spiritual care will be individualized to meet your needs and your family's needs. ? Spiritual care may involve:  Helping you look at what death means to you.  Helping you say goodbye to your family and friends.  Performing a specific religious ceremony or ritual.  When should hospice care begin? Most people who use hospice are believed to have fewer than 6 months to live.  Your family and health care providers can help you decide when hospice services should begin.  If your condition improves, you may discontinue the program.  What  should I consider before selecting a program? Most hospice programs are run by nonprofit, independent organizations. Some are affiliated with hospitals, nursing homes, or home health care agencies. Hospice programs can take place in the home or at a hospice center, hospital, or skilled nursing facility. When choosing a hospice program, ask the following questions:  What services are available to me?  What services will be offered to my loved ones?  How involved will my loved ones be?  How involved will my health care provider be?  Who makes up the hospice care team? How are they trained or screened?  How will my pain and symptoms be managed?  If my circumstances change, can the services be provided in a different setting, such as my home or in the hospital?  Is the program reviewed and licensed by the state or certified in some other way?  Where can I learn more about hospice? You can learn about existing hospice programs in your area from your health care providers. You can also read more about hospice online. The websites of the following organizations contain helpful information:  The [facility] and Palliative Care Organization [facility]).  The Hospice Association of America ([place]).  The [place].  The American Cancer Society (ACS).  Hospice Net.  This information is not intended to replace advice given to you by your health care provider. Make sure you discuss any questions you have with your health care provider. Document Released: 03/04/2004 Document Revised: 07/02/2016 Document Reviewed: 09/26/2013 Elsevier Interactive Patient Education  2017 [employer].

## 2017-08-06 NOTE — Clinical Social Work Note (Addendum)
[facility] [place]  Patient and family agreeable to plans will transport via ems RN to call report to Room [address]A [place] [phone].  CSW notified patient's daughter Kara Meadmma.  Windell MouldingEric Elihue Ebert, MSW, Theresia MajorsLCSWA [phone]

## 2017-08-06 NOTE — Progress Notes (Signed)
[facility]. Report called and pt will be going to rm 106A. All belongings packed and returned to patient. The patient was placed in a transfer pack and is awaiting EMS arrival.

## 2017-08-06 NOTE — Discharge Summary (Addendum)
[facility] - [place] [facility]   PATIENT NAME: Tonya LunaLevonia King    MR#:  [id number]  DATE OF BIRTH:  1926-08-25  DATE OF ADMISSION:  08/03/2017   ADMITTING PHYSICIAN: Milagros LollSrikar Sudini, MD  DATE OF DISCHARGE: 08/06/2017  PRIMARY CARE PHYSICIAN: Justin MendArnaez Zapata, Gerardo E, MD   ADMISSION DIAGNOSIS:  Hypoglycemia [E16.2] Abdominal pain [R10.9] Sepsis, due to unspecified organism (HCC) [A41.9] Altered mental status, unspecified altered mental status type [R41.82] Neutropenia, unspecified type (HCC) [D70.9] DISCHARGE DIAGNOSIS:  Active Problems:   Hypernatremia   DNR (do not resuscitate)   Palliative care encounter   Dehydration   Dementia without behavioral disturbance   Goals of care, counseling/discussion  SECONDARY DIAGNOSIS:   Past Medical History:  Diagnosis Date  . Abnormal posture   . Adult failure to thrive   . Anxiety   . Chronic kidney disease   . Dehydration   . Dementia   . Difficulty walking   . GERD (gastroesophageal reflux disease)   . Hypertension   . Muscle weakness (generalized)   . Sequelae of cerebrovascular disease   . Vitamin D deficiency    HOSPITAL COURSE:  * Severe sepsis secondary to ESBL UTI with leukopenia/hypothermia/ acute encephalopathy - Urine cultures growing ESBL E. coli and sensitive to bactim  - D/Ced on PO Bactrim for 7 days  * Hypernatremia with severe dehydration due to dementia and decreased oral intake - Resolved with hydration  * Hypokalemia - Repleted  * Acute metabolic encephalopathy over dementia due to UTI and dehydration.  * Sinus bradycardia likely due to sepsis. - Patient is DNR/DNI. No aggressive measures plan. No pacemaker.  * Hypothermia due to sepsis: resolved  * Pancytopenia: Due to sepsis  * severe protein-calorie malnutrition DISCHARGE CONDITIONS:  fair CONSULTS OBTAINED:  Treatment Team:  Mick SellFitzgerald, David P, MD DRUG ALLERGIES:  No Known Allergies DISCHARGE MEDICATIONS:    Allergies as of 08/06/2017   No Known Allergies     Medication List    TAKE these medications   acetaminophen 500 MG tablet Commonly known as:  TYLENOL Take 1,000 mg by mouth 2 (two) times daily.   cholecalciferol 1000 units tablet Commonly known as:  VITAMIN D Take 1,000 Units by mouth daily.   lactulose 10 GM/15ML solution Commonly known as:  CHRONULAC Take 20 g by mouth at bedtime.   lisinopril 5 MG tablet Commonly known as:  PRINIVIL,ZESTRIL Take 5 mg by mouth daily.   mirtazapine 15 MG tablet Commonly known as:  REMERON Take 15 mg by mouth at bedtime.   Propylene Glycol 0.6 % Soln Place 1 drop into both eyes at bedtime.   sulfamethoxazole-trimethoprim 200-40 MG/5ML suspension Commonly known as:  BACTRIM,SEPTRA Take 20 mLs by mouth every 12 (twelve) hours.   vitamin B-12 1000 MCG tablet Commonly known as:  CYANOCOBALAMIN Take 1,000 mcg by mouth every other day.            Discharge Care Instructions        Start     Ordered   08/06/17 0000  sulfamethoxazole-trimethoprim (BACTRIM,SEPTRA) 200-40 MG/5ML suspension  Every 12 hours     08/06/17 0741   08/06/17 0000  Increase activity slowly     08/06/17 0741   08/06/17 0000  Diet - low sodium heart healthy     08/06/17 0741     DISCHARGE INSTRUCTIONS:   DIET:  Regular diet DISCHARGE CONDITION:  Fair ACTIVITY:  Activity as tolerated OXYGEN:  Home Oxygen: No.  Oxygen Delivery:  room air DISCHARGE LOCATION:  nursing home with Palliative care to follow   If you experience worsening of your admission symptoms, develop shortness of breath, life threatening emergency, suicidal or homicidal thoughts you must seek medical attention immediately by calling 911 or calling your MD immediately  if symptoms less severe.  You Must read complete instructions/literature along with all the possible adverse reactions/side effects for all the Medicines you take and that have been prescribed to you. Take any new  Medicines after you have completely understood and accpet all the possible adverse reactions/side effects.   Please note  You were cared for by a hospitalist during your hospital stay. If you have any questions about your discharge medications or the care you received while you were in the hospital after you are discharged, you can call the unit and asked to speak with the hospitalist on call if the hospitalist that took care of you is not available. Once you are discharged, your primary care physician will handle any further medical issues. Please note that NO REFILLS for any discharge medications will be authorized once you are discharged, as it is imperative that you return to your primary care physician (or establish a relationship with a primary care physician if you do not have one) for your aftercare needs so that they can reassess your need for medications and monitor your lab values.    On the day of Discharge:  VITAL SIGNS:  Blood pressure (!) 190/87, pulse (!) 41, temperature (!) 97.5 F (36.4 C), temperature source Axillary, resp. rate 20, height  (1.6 m), weight 52.4 kg (115 lb 7 oz), SpO2 97 %. PHYSICAL EXAMINATION:  GENERAL:  Constitutional: Vital signs are normal. She appears malnourished and dehydrated. She appears unhealthy. She appears toxic. She has a sickly appearance.  HENT:  Head: Normocephalic and atraumatic.  Eyes: Pupils are equal, round, and reactive to light. Conjunctivae and EOM are normal.  Neck: Normal range of motion. Neck supple. No tracheal deviation present. No thyromegaly present.  Cardiovascular: Normal rate, regular rhythm and normal heart sounds.   Pulmonary/Chest: Effort normal and breath sounds normal. No respiratory distress. She has no wheezes. She exhibits no tenderness.  Abdominal: Soft. Bowel sounds are normal. She exhibits no distension. There is no tenderness.  Musculoskeletal: Normal range of motion.  Neurological: She is alert. She is  disoriented. No cranial nerve deficit.  Skin: Skin is warm and dry. No rash noted.  Psychiatric: Mood and affect normal.  DATA REVIEW:   CBC  Recent Labs Lab 08/06/17 0419  WBC 3.9  HGB 11.9*  HCT 34.8*  PLT 133*    Chemistries   Recent Labs Lab 08/03/17 1053  08/06/17 0419  NA 150*  < > 143  K 4.4  < > 3.7  CL 114*  < > 110  CO2 31  < > 29  GLUCOSE 82  < > 82  BUN 30*  < > 11  CREATININE 0.75  < > 0.67  CALCIUM 9.7  < > 9.0  MG 2.3  --   --   < > = values in this interval not displayed.   Microbiology Results  Urine c/s growing ESBL E.coli   Contact information for follow-up providers    Durenda Hurt, Flossie Buffy, MD. Schedule an appointment as soon as possible for a visit in 1 week(s).   Specialty:  Internal Medicine Contact information: [address] [place] [address] [phone]  Contact information for after-discharge care    Destination    [facility] Follow up.   Specialty:  Skilled Nursing Facility Contact information: [address] [place] [address] [phone]                 Overall very poor prognosis - eventually transition to Hospice  Management plans discussed with the patient, family (talked to [place] over phone) and they are in agreement.  CODE STATUS: DNR , palliative care to follow  TOTAL TIME TAKING CARE OF THIS PATIENT: 45 minutes.    Delfino Lovett M.D on 08/06/2017 at 8:53 AM  Between 7am to 6pm - Pager - [phone]  After 6pm go to [email or web address] - Social research officer, government  [facility] [place] [facility]  Office  [phone]  CC: Primary care physician; Durenda Hurt, Flossie Buffy, MD   Note: This dictation was prepared with Dragon dictation along with smaller phrase technology. Any transcriptional errors that result from this process are unintentional.

## 2017-08-06 NOTE — Care Management Important Message (Signed)
Important Message  Patient Details  Name: Erroll LunaLevonia Ciccarelli MRN: [id number] Date of Birth: October 04, 1926   Medicare Important Message Given:  Yes    Gwenette GreetBrenda S Katelyn Broadnax, RN 08/06/2017, 7:49 AM

## 2017-08-06 NOTE — Progress Notes (Signed)
New referral for palliative medicine consult for goals of care discussion [facility].  [facility].  Referral information obtained and faxed to referral intake.  Thank you for allowing participation in this patient's care.  Norris CrossKara H. Marshall, MA, BSN, RN, FNE Clinical Nurse Liaison [facility] [place]

## 2017-08-08 LAB — CULTURE, BLOOD (ROUTINE X 2)
Culture: NO GROWTH
Special Requests: ADEQUATE

## 2017-08-30 DEATH — deceased

## 2017-09-15 IMAGING — CT CT HEAD W/O CM
3 series · 16 of 46 positions shown, 19 images · non-contrast
Comparison: Head CT scan 04/26/2016.

CLINICAL DATA: The patient had an episode of unresponsiveness this
morning. No known injury.

EXAM:
CT HEAD WITHOUT CONTRAST
TECHNIQUE: Contiguous axial images were obtained from the base of the skull
through the vertex without intravenous contrast.

[Series 2: head wo · axial · 0.45mm/px · z∈[-120,+0]mm · 10 of 29 slices shown, 13 images]
[im 3/29  brain]
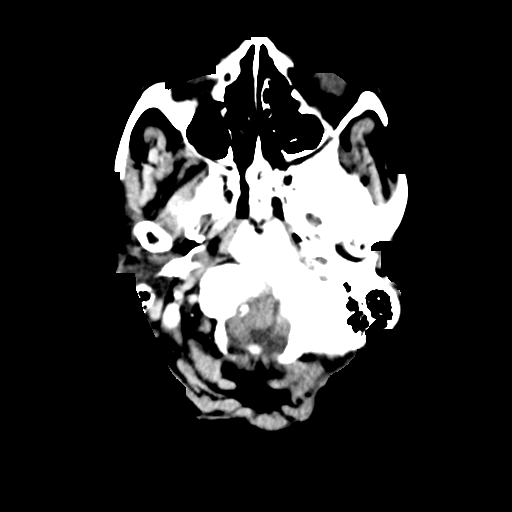
[im 3/29  bone]
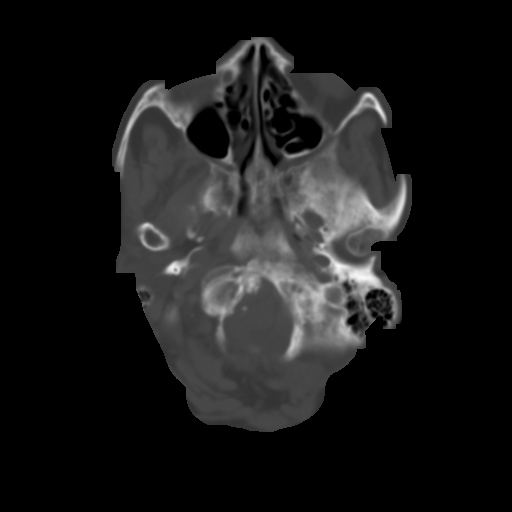
[im 6/29  brain]
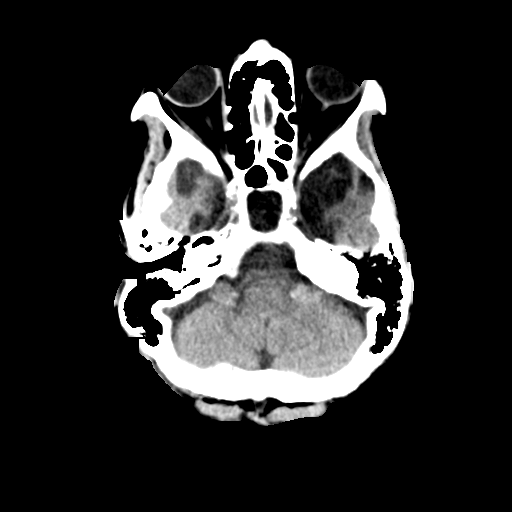
[im 8/29  brain]
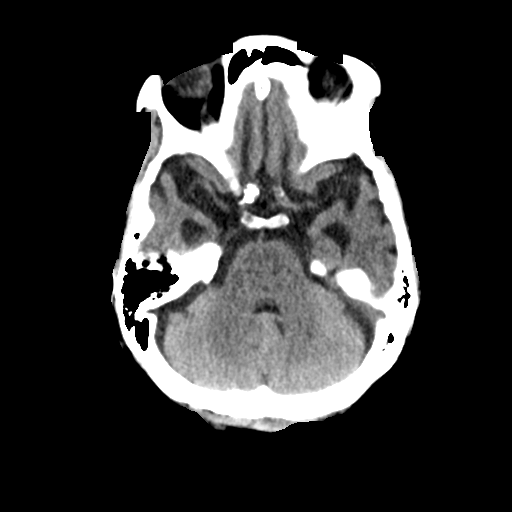
[im 11/29  brain]
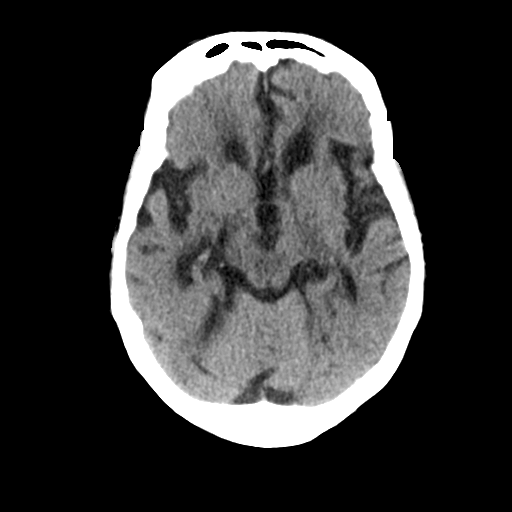
[im 14/29  brain]
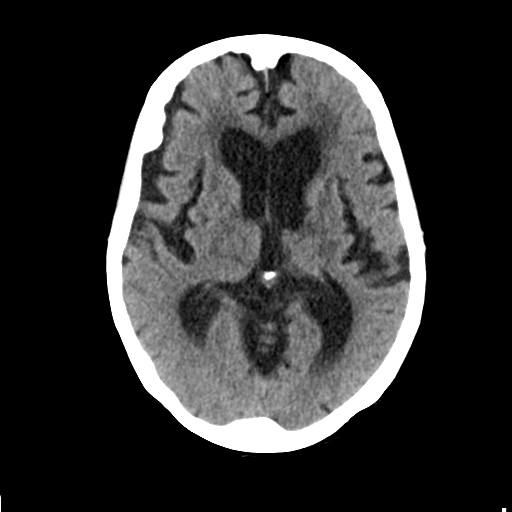
[im 14/29  bone]
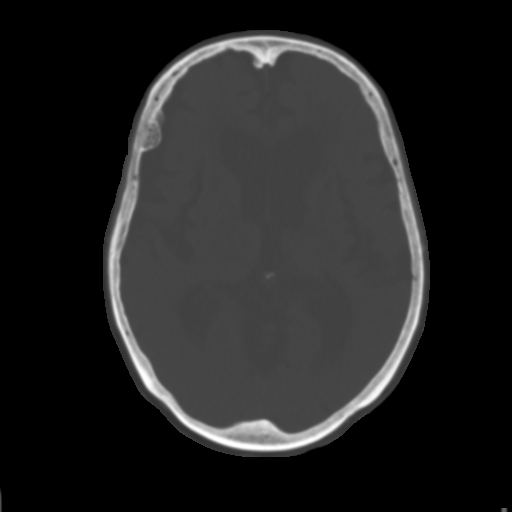
[im 16/29  brain]
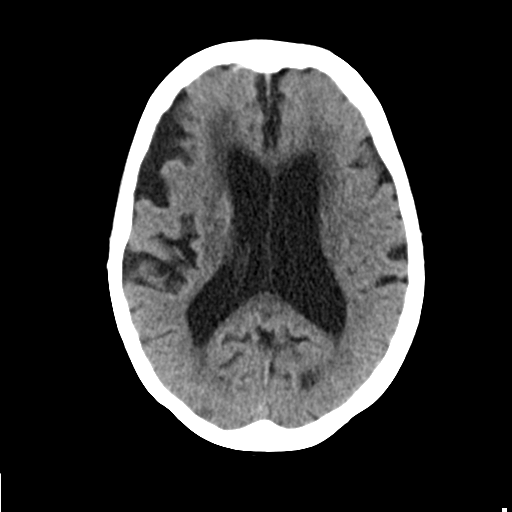
[im 19/29  brain]
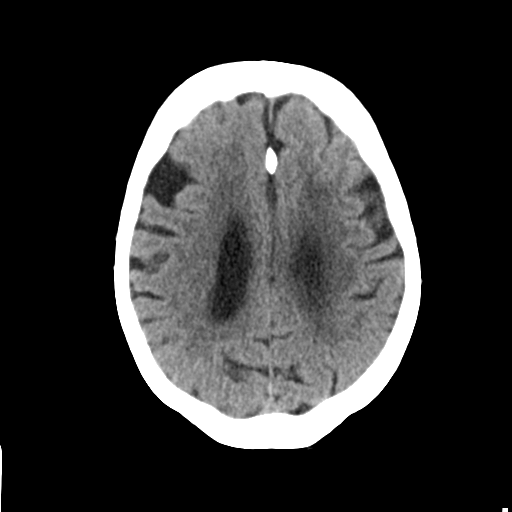
[im 22/29  brain]
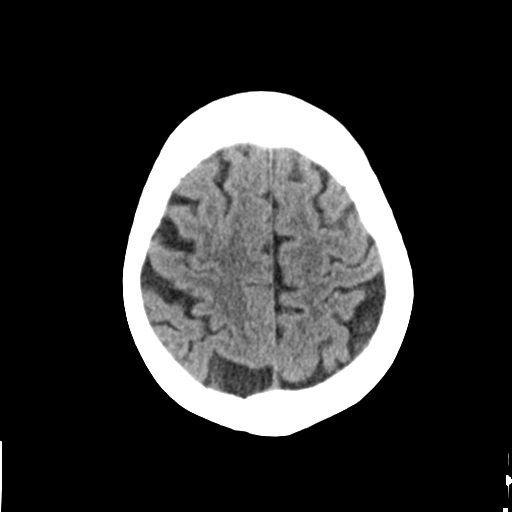
[im 24/29  brain]
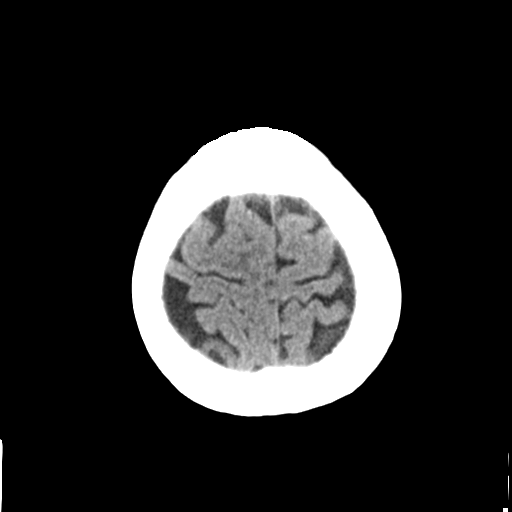
[im 24/29  bone]
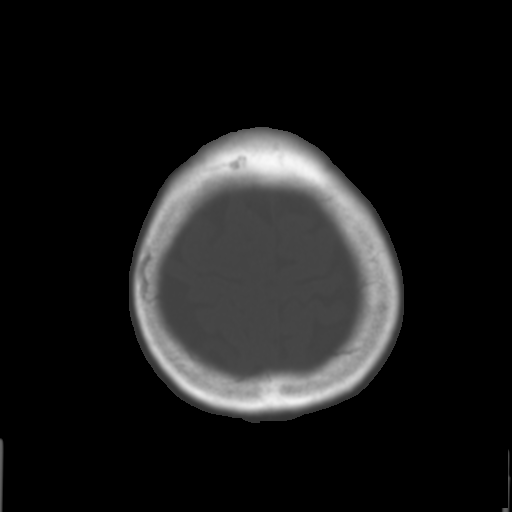
[im 27/29  brain]
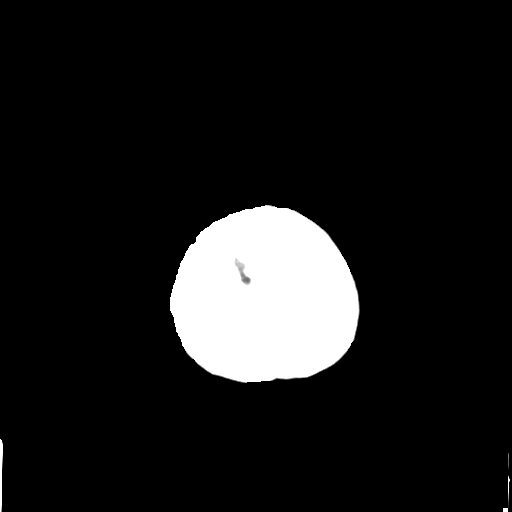

[Series 4: coronal soft tissue · coronal · 0.35mm/px · 3 of 63 slices shown]
[im 21/63  brain]
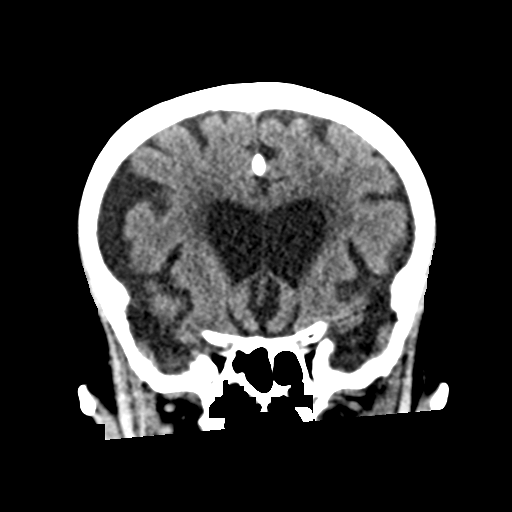
[im 28/63  brain]
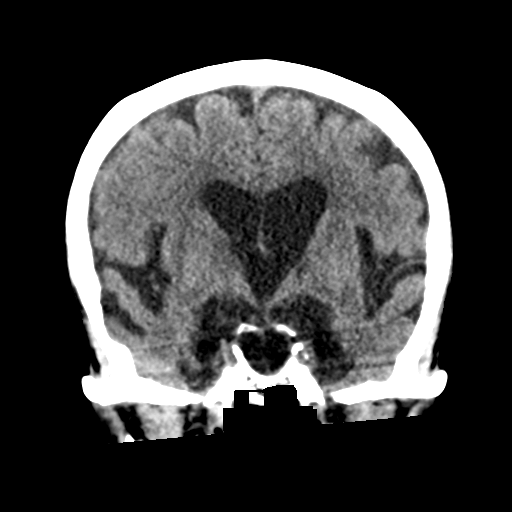
[im 35/63  brain]
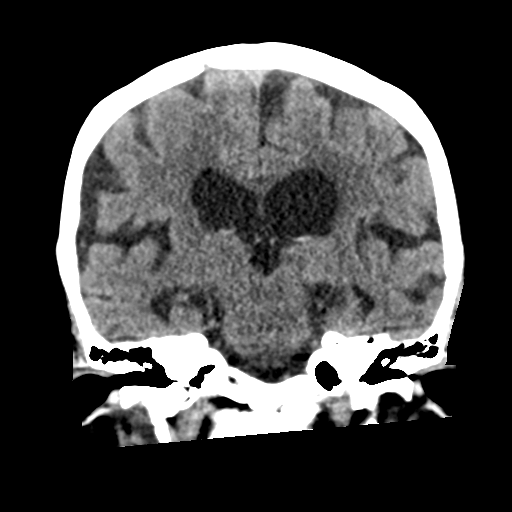

[Series 5: sagittal soft tissue · sagittal · 0.35mm/px · 3 of 52 slices shown]
[im 18/52  brain]
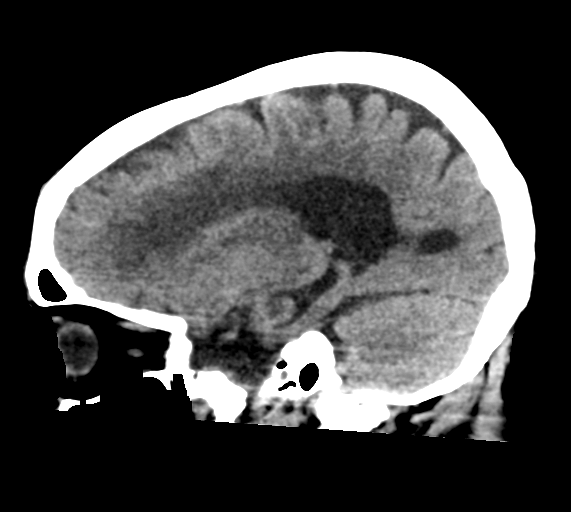
[im 26/52  brain]
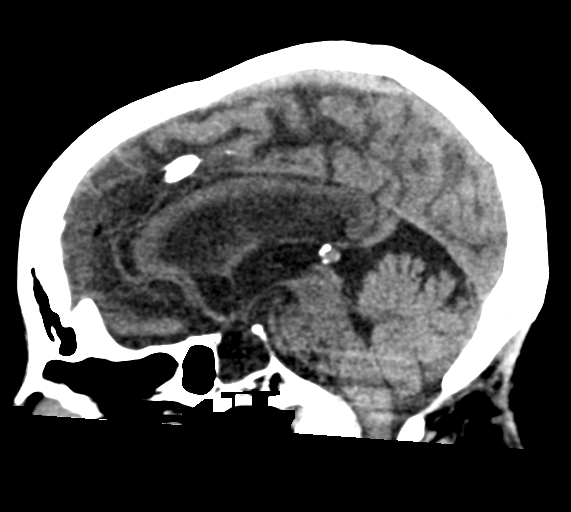
[im 35/52  brain]
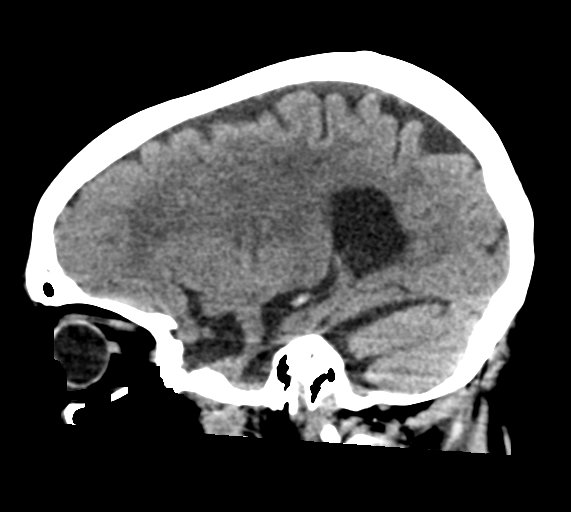

[16 of 46 positions shown; findings below may reference images not displayed]

FINDINGS: Brain: Cortical atrophy and chronic microvascular ischemic change
are identified. No evidence of acute abnormality including
hemorrhage, infarct, mass lesion, mass effect, midline shift or
abnormal extra-axial fluid collection. No hydrocephalus or
pneumocephalus.

Vascular: Atherosclerosis noted.

Skull: Intact.

Sinuses/Orbits: Negative.

Other: None.
IMPRESSION: No acute abnormality.

Atrophy and chronic microvascular ischemic change.
